# Patient Record
Sex: Female | Born: 1935
Health system: Southern US, Community
[De-identification: ages and names within clinical notes are randomized; demographics above are authoritative.]

---

## 1983-08-01 HISTORY — PX: MASTECTOMY: SHX3

## 1985-07-31 HISTORY — PX: BREAST SURGERY: SHX581

## 2005-04-27 ENCOUNTER — Emergency Department (HOSPITAL_COMMUNITY): Admission: EM | Admit: 2005-04-27 | Discharge: 2005-04-27 | Payer: Self-pay | Admitting: Emergency Medicine

## 2011-09-12 DIAGNOSIS — Z1212 Encounter for screening for malignant neoplasm of rectum: Secondary | ICD-10-CM | POA: Diagnosis not present

## 2011-09-12 DIAGNOSIS — E049 Nontoxic goiter, unspecified: Secondary | ICD-10-CM | POA: Diagnosis not present

## 2011-09-12 DIAGNOSIS — Z0000 Encounter for general adult medical examination without abnormal findings: Secondary | ICD-10-CM | POA: Diagnosis not present

## 2011-09-12 DIAGNOSIS — C50919 Malignant neoplasm of unspecified site of unspecified female breast: Secondary | ICD-10-CM | POA: Diagnosis not present

## 2011-09-18 DIAGNOSIS — C50919 Malignant neoplasm of unspecified site of unspecified female breast: Secondary | ICD-10-CM | POA: Diagnosis not present

## 2011-09-18 DIAGNOSIS — Z0000 Encounter for general adult medical examination without abnormal findings: Secondary | ICD-10-CM | POA: Diagnosis not present

## 2011-09-21 DIAGNOSIS — Z9010 Acquired absence of unspecified breast and nipple: Secondary | ICD-10-CM | POA: Diagnosis not present

## 2011-09-21 DIAGNOSIS — Z853 Personal history of malignant neoplasm of breast: Secondary | ICD-10-CM | POA: Diagnosis not present

## 2013-04-01 ENCOUNTER — Other Ambulatory Visit: Payer: Self-pay | Admitting: Endocrinology

## 2013-04-01 DIAGNOSIS — E049 Nontoxic goiter, unspecified: Secondary | ICD-10-CM

## 2013-04-03 ENCOUNTER — Ambulatory Visit
Admission: RE | Admit: 2013-04-03 | Discharge: 2013-04-03 | Disposition: A | Discharge status: Home / Self Care | Payer: Medicare Other | Source: Ambulatory Visit | Attending: Endocrinology | Admitting: Endocrinology

## 2013-04-03 DIAGNOSIS — E049 Nontoxic goiter, unspecified: Secondary | ICD-10-CM

## 2013-04-03 DIAGNOSIS — E042 Nontoxic multinodular goiter: Secondary | ICD-10-CM | POA: Diagnosis not present

## 2013-05-27 DIAGNOSIS — Z0000 Encounter for general adult medical examination without abnormal findings: Secondary | ICD-10-CM | POA: Diagnosis not present

## 2013-06-03 DIAGNOSIS — E041 Nontoxic single thyroid nodule: Secondary | ICD-10-CM | POA: Diagnosis not present

## 2013-06-04 DIAGNOSIS — R922 Inconclusive mammogram: Secondary | ICD-10-CM | POA: Diagnosis not present

## 2013-06-04 DIAGNOSIS — Z1231 Encounter for screening mammogram for malignant neoplasm of breast: Secondary | ICD-10-CM | POA: Diagnosis not present

## 2013-06-04 DIAGNOSIS — Z853 Personal history of malignant neoplasm of breast: Secondary | ICD-10-CM | POA: Diagnosis not present

## 2013-06-09 ENCOUNTER — Other Ambulatory Visit: Payer: Self-pay | Admitting: Endocrinology

## 2013-06-09 ENCOUNTER — Other Ambulatory Visit (HOSPITAL_COMMUNITY): Payer: Self-pay | Admitting: Interventional Radiology

## 2013-06-09 DIAGNOSIS — E042 Nontoxic multinodular goiter: Secondary | ICD-10-CM

## 2013-06-09 DIAGNOSIS — Z1211 Encounter for screening for malignant neoplasm of colon: Secondary | ICD-10-CM | POA: Diagnosis not present

## 2014-07-06 DIAGNOSIS — E049 Nontoxic goiter, unspecified: Secondary | ICD-10-CM | POA: Diagnosis not present

## 2014-07-06 DIAGNOSIS — Z1239 Encounter for other screening for malignant neoplasm of breast: Secondary | ICD-10-CM | POA: Diagnosis not present

## 2014-07-06 DIAGNOSIS — Z0000 Encounter for general adult medical examination without abnormal findings: Secondary | ICD-10-CM | POA: Diagnosis not present

## 2014-08-04 ENCOUNTER — Ambulatory Visit: Payer: Self-pay | Admitting: Internal Medicine

## 2014-08-04 DIAGNOSIS — Z1231 Encounter for screening mammogram for malignant neoplasm of breast: Secondary | ICD-10-CM | POA: Diagnosis not present

## 2015-09-01 ENCOUNTER — Encounter: Payer: Self-pay | Admitting: Endocrinology

## 2015-09-01 ENCOUNTER — Ambulatory Visit (INDEPENDENT_AMBULATORY_CARE_PROVIDER_SITE_OTHER): Payer: Medicare Other | Admitting: Endocrinology

## 2015-09-01 VITALS — BP 128/60 | HR 97 | Temp 98.2°F | Resp 16 | Ht 65.0 in | Wt 147.0 lb

## 2015-09-01 DIAGNOSIS — E042 Nontoxic multinodular goiter: Secondary | ICD-10-CM

## 2015-09-01 LAB — T3, FREE: T3 FREE: 3.1 pg/mL (ref 2.3–4.2)

## 2015-09-01 LAB — TSH: TSH: 1.5 u[IU]/mL (ref 0.35–4.50)

## 2015-09-01 NOTE — Progress Notes (Signed)
Patient ID: Tracie Blackwell, female   DOB: 1934-03-04, 82 y.o.   MRN: [id number]           Reason for Appointment: Goiter, new consultation    History of Present Illness:   The patient's thyroid enlargement was first discovered in the 1960s  Apparently at that time because of relatively large size of nodules she had surgery done twice in [place]  However subsequently she has had recurrence of swelling in her neck She was evaluated in 2014 with an endocrinologist but no treatment suggested She is here for further evaluation because she thinks in the last year her thyroid enlargement is progressively increasing especially on the left side  She has had no difficulty with swallowing   Does not feel like she has any choking sensation in her neck or pressure in any position or when lying down. She does not have any local discomfort  No results found for: FREET4, TSH  She has had an ultrasound exam in 06/2013 which showed the following: Multiple large nodules bilaterally. The dominant nodule in the right lobe is in the upper pole measuring 3.4 cm. The dominant nodule in the left lobe is a complex nodule in the left upper pole measuring 6.1 cm.       Medication List    Notice  As of 09/01/2015  8:57 PM   You have not been prescribed any medications.      Allergies:  Allergies  Allergen Reactions  . Penicillins     Other reaction(s): Angioedema    No past medical history on file.  Past Surgical History  Procedure Laterality Date  . Breast surgery  1987    Family History  Problem Relation Age of Onset  . Diabetes Mother   . Hypertension Mother   . Thyroid disease Sister   . Diabetes Sister   . Diabetes Brother   . Hypertension Father     Social History:  reports that she has never smoked. She has never used smokeless tobacco. Her alcohol and drug histories are not on file.   Review of Systems:  Review of Systems  Constitutional: Negative for weight loss,  weight gain and reduced appetite.  HENT: Negative for hoarseness and trouble swallowing.   Respiratory: Negative for shortness of breath.   Cardiovascular: Negative for leg swelling.  Gastrointestinal: Negative for constipation.  Endocrine: Negative for fatigue, cold intolerance and heat intolerance.      Examination:   BP 128/60 mmHg  Pulse 97  Temp(Src) 98.2 F (36.8 C)  Resp 16  Ht 5\' 5"  (1.651 m)  Wt 147 lb (66.679 kg)  BMI 24.46 kg/m2  SpO2 97%   General Appearance: pleasant, averagely built and nourished, well looking          Eyes: No  prominence or eyelid swelling.          Neck: The thyroid is enlarged significantly, more on the left.  Thyroid is about 5-6 Times normal on the left side and about 4 times on the right.  It is relatively smooth and slightly firm.  Has a 2 cm firmer nodule in the isthmus area  Neck circumference is  41 cm over the thyroid There is no stridor. Pemberton sign is negative  There is no lymphadenopathy.     Cardiovascular: Normal  heart sounds, no murmur Respiratory:  Lungs clear Abdomen: No hepatosplenomegaly or other mass Neurological: REFLEXES: at biceps are normal.  Skin: no rash  Assessment/Plan:  Multinodular goiter, probably diagnosed 50+ years ago This appears to be getting gradually larger in size Appears to be mostly external as she does not have any subjective compressive symptoms of dysphagia or choking. Her exam does not indicate any tracheal compression and Pemberton sign negative She is not interested in surgery and is asking about medical treatment or aspiration.  Discussed with the patient that usually thyroid suppression does not help reduce the size of the goiter especially if it has been long-standing Will need to review her thyroid levels decide on further treatment if any    [facility] 09/01/2015

## 2015-09-02 LAB — T4, FREE: Free T4: 0.54 ng/dL — ABNORMAL LOW (ref 0.60–1.60)

## 2015-09-06 NOTE — Progress Notes (Signed)
Quick Note:  Please let patient know that the thyroid levels looks a little low, we will like to try her on 50 g levothyroxine daily. Follow-up to be scheduled in 2 months with labs For follow-up order free T4, TSH and FSH diagnosis: Secondary hypothyroidism ______

## 2015-09-07 ENCOUNTER — Telehealth: Payer: Self-pay | Admitting: Internal Medicine

## 2015-09-07 NOTE — Telephone Encounter (Signed)
Patient is returning your call.  

## 2015-09-07 NOTE — Telephone Encounter (Signed)
Tracie Blackwell, this is your patient.

## 2015-09-08 ENCOUNTER — Other Ambulatory Visit: Payer: Self-pay | Admitting: Endocrinology

## 2015-09-08 MED ORDER — LEVOTHYROXINE SODIUM 50 MCG PO TABS: 50.0000 ug | ORAL_TABLET | Freq: Every day | ORAL | Status: DC

## 2015-09-08 NOTE — Telephone Encounter (Signed)
[facility] [address] Pt is willing to try it

## 2015-09-08 NOTE — Telephone Encounter (Signed)
See note below and please advise with med and dosage should be sent.

## 2015-09-08 NOTE — Telephone Encounter (Signed)
Prescription sent.  Please schedule appointment for 2 months, labs 2 days before

## 2015-09-09 ENCOUNTER — Other Ambulatory Visit: Payer: Self-pay | Admitting: Endocrinology

## 2015-09-09 DIAGNOSIS — E038 Other specified hypothyroidism: Secondary | ICD-10-CM

## 2015-11-02 ENCOUNTER — Other Ambulatory Visit (INDEPENDENT_AMBULATORY_CARE_PROVIDER_SITE_OTHER): Payer: Medicare Other

## 2015-11-02 DIAGNOSIS — E038 Other specified hypothyroidism: Secondary | ICD-10-CM | POA: Diagnosis not present

## 2015-11-02 DIAGNOSIS — E042 Nontoxic multinodular goiter: Secondary | ICD-10-CM | POA: Diagnosis not present

## 2015-11-02 LAB — TSH: TSH: 0.21 u[IU]/mL — AB (ref 0.35–4.50)

## 2015-11-02 LAB — FOLLICLE STIMULATING HORMONE: FSH: 49.3 m[IU]/mL

## 2015-11-02 LAB — T4, FREE: Free T4: 0.99 ng/dL (ref 0.60–1.60)

## 2015-11-04 ENCOUNTER — Encounter: Payer: Self-pay | Admitting: Endocrinology

## 2015-11-04 ENCOUNTER — Ambulatory Visit (INDEPENDENT_AMBULATORY_CARE_PROVIDER_SITE_OTHER): Payer: Medicare Other | Admitting: Endocrinology

## 2015-11-04 VITALS — BP 140/70 | HR 92 | Temp 98.3°F | Resp 14 | Ht 65.0 in | Wt 147.8 lb

## 2015-11-04 DIAGNOSIS — E042 Nontoxic multinodular goiter: Secondary | ICD-10-CM | POA: Diagnosis not present

## 2015-11-04 DIAGNOSIS — E038 Other specified hypothyroidism: Secondary | ICD-10-CM | POA: Diagnosis not present

## 2015-11-04 NOTE — Progress Notes (Signed)
Patient ID: Tracie Blackwell, female   DOB: April 16, 1934, 82 y.o.   MRN: [id number]           Reason for Appointment: Goiter, follow-up consultation    History of Present Illness:   The patient's thyroid enlargement was first discovered in the 1960s  Apparently at that time because of relatively large size of nodules she had surgery done twice in [place]  However subsequently she has had recurrence of swelling in her neck She was evaluated in 2014 with an endocrinologist but no treatment suggested Apparently in the last year her thyroid enlargement was progressively increasing especially on the left side  She has had no difficulty with swallowing, choking or local pressure On her initial visit she did not feel any unusual fatigue or cold intolerance  However because of her free T4 being only 0.54 and normal TSH she was started on levothyroxine 50 g as a trial Although subjectively she does not feel better with her energy level she thinks that her thyroid swelling significantly less especially when she wakes up in the morning  Lab Results  Component Value Date   FREET4 0.99 11/02/2015   FREET4 0.54* 09/01/2015   TSH 0.21* 11/02/2015   TSH 1.50 09/01/2015    She has had an ultrasound exam in 06/2013 which showed the following: Multiple large nodules bilaterally. The dominant nodule in the right lobe is in the upper pole measuring 3.4 cm. The dominant nodule in the left lobe is a complex nodule in the left upper pole measuring 6.1 cm.       Medication List       This list is accurate as of: 11/04/15  1:10 PM.  Always use your most recent med list.               levothyroxine 50 MCG tablet  Commonly known as:  SYNTHROID, LEVOTHROID  Take 1 tablet (50 mcg total) by mouth daily.        Allergies:  Allergies  Allergen Reactions  . Penicillins     Other reaction(s): Angioedema    No past medical history on file.  Past Surgical History  Procedure Laterality Date    . Breast surgery  1987    Family History  Problem Relation Age of Onset  . Diabetes Mother   . Hypertension Mother   . Thyroid disease Sister   . Diabetes Sister   . Diabetes Brother   . Hypertension Father     Social History:  reports that she has never smoked. She has never used smokeless tobacco. Her alcohol and drug histories are not on file.    Review of Systems      Examination:   BP 140/70 mmHg  Pulse 92  Temp(Src) 98.3 F (36.8 C)  Resp 14  Ht 5\' 5"  (1.651 m)  Wt 147 lb 12.8 oz (67.042 kg)  BMI 24.60 kg/m2  SpO2 98%            Neck: The thyroid is enlarged significantly, more on the left.  Thyroid is about 5- Times normal on the left side and about 3-4 times on the right.  It is relatively smooth and slightly firm, softer on the right.  Has a 2 cm firmer nodule in the isthmus area  Neck circumference is 39 cm over the thyroid Neurological: No tremor, REFLEXES: at biceps are normal.    Assessment/Plan:  Multinodular goiter, probably diagnosed 50+ years ago With only a short course of levothyroxine 50 g  she has had a reduction in size of the goiter both subjectively and objectively  Secondary HYPOTHYROIDISM: With her free T4 being low and TSH normal she probably has mild subclinical hypothyroidism Free T4 is back to normal with 50 g although TSH is mildly decreased as expected No signs or symptoms of over replacement of thyroxine  Patient was continued on the same regimen but follow-up in 3 months to recheck her thyroid levels    [facility] 11/04/2015

## 2016-01-14 ENCOUNTER — Other Ambulatory Visit: Payer: Self-pay | Admitting: Endocrinology

## 2016-01-31 ENCOUNTER — Other Ambulatory Visit (INDEPENDENT_AMBULATORY_CARE_PROVIDER_SITE_OTHER): Payer: Medicare Other

## 2016-01-31 DIAGNOSIS — E042 Nontoxic multinodular goiter: Secondary | ICD-10-CM | POA: Diagnosis not present

## 2016-01-31 LAB — T4, FREE: FREE T4: 0.8 ng/dL (ref 0.60–1.60)

## 2016-01-31 LAB — TSH: TSH: 0.5 u[IU]/mL (ref 0.35–4.50)

## 2016-02-03 ENCOUNTER — Ambulatory Visit (INDEPENDENT_AMBULATORY_CARE_PROVIDER_SITE_OTHER): Payer: Medicare Other | Admitting: Endocrinology

## 2016-02-03 ENCOUNTER — Encounter: Payer: Self-pay | Admitting: Endocrinology

## 2016-02-03 VITALS — BP 150/60 | HR 77 | Wt 145.5 lb

## 2016-02-03 DIAGNOSIS — E042 Nontoxic multinodular goiter: Secondary | ICD-10-CM

## 2016-02-03 NOTE — Progress Notes (Signed)
Patient ID: Tracie Blackwell, female   DOB: [birth year], 83 y.o.   MRN: [id number]           Reason for Appointment: Goiter, follow-up visit    History of Present Illness:   The patient's thyroid enlargement was first discovered in the 1960s  Apparently at that time because of relatively large size of nodules she had surgery done twice in [place] However subsequently she has had recurrence of swelling in her neck Apparently in 2016 her thyroid enlargement was progressively increasing especially on the left side  RECENT history:   On her initial visit she did not feel any unusual fatigue or cold intolerance However because of her free T4 being only 0.54 and normal TSH she was started on levothyroxine 50 g as a trial Although subjectively she did not feel better with starting the levothyroxine supplement  she stated that her thyroid swelling  feelings significantly less and less lumpy  She has had no difficulty with swallowing, choking or local pressure  Subsequently free T4 levels have been normal and now TSH is normal also  Lab Results  Component Value Date   FREET4 0.80 01/31/2016   FREET4 0.99 11/02/2015   FREET4 0.54* 09/01/2015   TSH 0.50 01/31/2016   TSH 0.21* 11/02/2015   TSH 1.50 09/01/2015    She has had an ultrasound exam in 06/2013 which showed the following: Multiple large nodules bilaterally. The dominant nodule in the right lobe is in the upper pole measuring 3.4 cm. The dominant nodule in the left lobe is a complex nodule in the left upper pole measuring 6.1 cm.       Medication List       This list is accurate as of: 02/03/16  1:06 PM.  Always use your most recent med list.               levothyroxine 50 MCG tablet  Commonly known as:  SYNTHROID, LEVOTHROID  TAKE 1 TABLET BY MOUTH EVERY DAY        Allergies:  Allergies  Allergen Reactions  . Penicillins     Other reaction(s): Angioedema    No past medical history on file.  Past Surgical  History  Procedure Laterality Date  . Breast surgery  1987    Family History  Problem Relation Age of Onset  . Diabetes Mother   . Hypertension Mother   . Thyroid disease Sister   . Diabetes Sister   . Diabetes Brother   . Hypertension Father     Social History:  reports that she has never smoked. She has never used smokeless tobacco. Her alcohol and drug histories are not on file.    Review of Systems    Examination:   BP 150/60 mmHg  Pulse 77  Wt 145 lb 8 oz (65.998 kg)  SpO2 97%             The thyroid is enlarged significantly, more on the left.  Thyroid is about 4-5 Times normal on the left side and about 3-4 times on the right.   It is smooth and soft, especially on the right.  Has a 1.5 cm firmer nodule in the isthmus area  Neck circumference is 39 cm over the thyroid Deep tendon reflexes at biceps are normal.    Assessment/Plan:  Multinodular goiter, probably diagnosed 50+ years ago With a regimen of levothyroxine 50 g she has had a reduction in size of the goiter both subjectively and objectively, stable since  her last visit The currently feels relatively soft probably indicating accumulation of colloid   Smaller firmer nodule in the isthmus is smaller also  Secondary HYPOTHYROIDISM:  Although she was probably asymptomatic with her baseline free T4 level being low her free T4 is now consistently normal with the low-dose supplement Subjectively she still feels fairly good  TSH is normal now  She will continue on the same regimen with six-month follow-up    [facility] 02/03/2016

## 2016-05-14 ENCOUNTER — Other Ambulatory Visit: Payer: Self-pay | Admitting: Endocrinology

## 2016-08-01 ENCOUNTER — Other Ambulatory Visit (INDEPENDENT_AMBULATORY_CARE_PROVIDER_SITE_OTHER): Payer: Medicare Other

## 2016-08-01 DIAGNOSIS — E042 Nontoxic multinodular goiter: Secondary | ICD-10-CM | POA: Diagnosis not present

## 2016-08-01 LAB — T4, FREE: Free T4: 0.7 ng/dL (ref 0.60–1.60)

## 2016-08-01 LAB — TSH: TSH: 0.72 u[IU]/mL (ref 0.35–4.50)

## 2016-08-04 ENCOUNTER — Encounter: Payer: Self-pay | Admitting: Endocrinology

## 2016-08-04 ENCOUNTER — Ambulatory Visit (INDEPENDENT_AMBULATORY_CARE_PROVIDER_SITE_OTHER): Payer: Medicare Other | Admitting: Endocrinology

## 2016-08-04 VITALS — BP 130/68 | HR 76 | Ht 66.93 in | Wt 147.4 lb

## 2016-08-04 DIAGNOSIS — E042 Nontoxic multinodular goiter: Secondary | ICD-10-CM

## 2016-08-04 MED ORDER — LEVOTHYROXINE SODIUM 125 MCG PO TABS: 62.5000 ug | ORAL_TABLET | Freq: Every day | ORAL | 4 refills | Status: DC

## 2016-08-04 NOTE — Progress Notes (Signed)
Patient ID: Tracie Blackwell, female   DOB: 1933/08/05, 83 y.o.   MRN: [id number]           Reason for Appointment: Goiter, follow-up visit    History of Present Illness:   The patient's thyroid enlargement was first discovered in the 1960s  Apparently at that time because of relatively large size of nodules she had surgery done twice in [place] However subsequently she has had recurrence of swelling in her neck Apparently in 2016 her thyroid enlargement was progressively increasing especially on the left side  RECENT history:   On her initial visit she did not feel any unusual fatigue or cold intolerance However because of her free T4 being only 0.54 and normal TSH she was started on levothyroxine 50 g  Although subjectively she did not feel better with starting the levothyroxine supplement she subjectively and objectively felt her thyroid swelling to be reduced She now also states that she can lie flat in her bed instead of having to prop her head on pillows as before Also she does not has much hoarseness on talking  Her free T4 appears to be declining compared to 4/17 although she does not think she has any unusual fatigue now   Lab Results  Component Value Date   FREET4 0.70 08/01/2016   FREET4 0.80 01/31/2016   FREET4 0.99 11/02/2015   TSH 0.72 08/01/2016   TSH 0.50 01/31/2016   TSH 0.21 (L) 11/02/2015    She has had an ultrasound exam in 06/2013 which showed the following: Multiple large nodules bilaterally. The dominant nodule in the right lobe is in the upper pole measuring 3.4 cm. The dominant nodule in the left lobe is a complex nodule in the left upper pole measuring 6.1 cm.     Allergies as of 08/04/2016      Reactions   Penicillins    Other reaction(s): Angioedema      Medication List       Accurate as of 08/04/16  1:12 PM. Always use your most recent med list.          levothyroxine 50 MCG tablet Commonly known as:  SYNTHROID, LEVOTHROID TAKE 1  TABLET BY MOUTH EVERY DAY       Allergies:  Allergies  Allergen Reactions  . Penicillins     Other reaction(s): Angioedema    No past medical history on file.  Past Surgical History:  Procedure Laterality Date  . BREAST SURGERY  1987    Family History  Problem Relation Age of Onset  . Diabetes Mother   . Hypertension Mother   . Thyroid disease Sister   . Diabetes Sister   . Diabetes Brother   . Hypertension Father     Social History:  reports that she has never smoked. She has never used smokeless tobacco. Her alcohol and drug histories are not on file.    Review of Systems    Examination:   BP 130/68   Pulse 76   Ht 5' 6.93" (1.7 m)   Wt 147 lb 6.4 oz (66.9 kg)   SpO2 97%   BMI 23.14 kg/m              The thyroid is enlarged significantly, more on the left.  Thyroid is about 4-5 Times normal on the left side and about 3-4 times on the right.   It is smooth and soft, especially on the right.  Has a 1.5 cm firmer nodule in the isthmus area  Neck circumference is 39.5 cm over the thyroid Deep tendon reflexes at biceps are normal.    Assessment/Plan:  Multinodular goiter, diagnosed 50+ years ago With the continued regimen of levothyroxine 50 g she has had subjectively less pressure symptoms as above Also previously had a reduction in size of the goiter objectively  Currently the thyroid feels about the same or minimally larger on measurement of the neck circumference. Still has a relatively soft texture  Secondary HYPOTHYROIDISM:  She was  asymptomatic with baseline free T4 level being low Has been continued on 50 g levothyroxine but her free T4 is drifting lower compared to her previous 2 follow-up visits  Subjectively she still feels fairly good  Since she may benefit from further reduction in the size of the goiter will increase her dose to 62.5 g levothyroxine and follow-up in 4 months   [facility] 08/04/2016

## 2016-11-27 ENCOUNTER — Other Ambulatory Visit (INDEPENDENT_AMBULATORY_CARE_PROVIDER_SITE_OTHER): Payer: Medicare Other

## 2016-11-27 DIAGNOSIS — E042 Nontoxic multinodular goiter: Secondary | ICD-10-CM

## 2016-11-27 LAB — TSH: TSH: 0.63 u[IU]/mL (ref 0.35–4.50)

## 2016-11-27 LAB — T4, FREE: FREE T4: 0.76 ng/dL (ref 0.60–1.60)

## 2016-11-28 ENCOUNTER — Other Ambulatory Visit: Payer: Medicare Other

## 2016-12-01 ENCOUNTER — Encounter: Payer: Self-pay | Admitting: Endocrinology

## 2016-12-01 ENCOUNTER — Ambulatory Visit (INDEPENDENT_AMBULATORY_CARE_PROVIDER_SITE_OTHER): Payer: Medicare Other | Admitting: Endocrinology

## 2016-12-01 VITALS — BP 130/78 | HR 81 | Ht 66.0 in | Wt 144.2 lb

## 2016-12-01 DIAGNOSIS — E042 Nontoxic multinodular goiter: Secondary | ICD-10-CM | POA: Diagnosis not present

## 2016-12-01 NOTE — Progress Notes (Signed)
Patient ID: Tracie Blackwell, female   DOB: 1934/06/09, 83 y.o.   MRN: [id number]           Reason for Appointment: Goiter, follow-up visit    History of Present Illness:   The patient's thyroid enlargement was first discovered in the 1960s  Apparently at that time because of relatively large size of nodules she had surgery done twice in [place] However subsequently she has had recurrence of swelling in her neck Apparently in 2016 her thyroid enlargement was progressively increasing especially on the left side  RECENT history:   On her initial visit she did not feel any unusual fatigue or cold intolerance However because of her free T4 being only 0.54 and normal TSH she was started on levothyroxine 50 g  Although subjectively she did not feel better with starting the levothyroxine supplement she subjectively and objectively felt her thyroid swelling to be reduced She now also states that she can lie flat in her bed instead of having to prop her head on pillows as before Also she does not has much hoarseness  No difficulty swallowing   Her free T4 was low normal in 1/18 and her dose was increased up to 62.5 g daily She feels great with her energy level now Initially with the change she thought she was getting more hair loss but this is better now   Lab Results  Component Value Date   FREET4 0.76 11/27/2016   FREET4 0.70 08/01/2016   FREET4 0.80 01/31/2016   TSH 0.63 11/27/2016   TSH 0.72 08/01/2016   TSH 0.50 01/31/2016    She has had an ultrasound exam in 06/2013 which showed the following: Multiple large nodules bilaterally. The dominant nodule in the right lobe is in the upper pole measuring 3.4 cm. The dominant nodule in the left lobe is a complex nodule in the left upper pole measuring 6.1 cm.     Allergies as of 12/01/2016      Reactions   Penicillins    Other reaction(s): Angioedema      Medication List       Accurate as of 12/01/16  1:08 PM. Always use  your most recent med list.          levothyroxine 125 MCG tablet Commonly known as:  SYNTHROID, LEVOTHROID Take 0.5 tablets (62.5 mcg total) by mouth daily.       Allergies:  Allergies  Allergen Reactions  . Penicillins     Other reaction(s): Angioedema    No past medical history on file.  Past Surgical History:  Procedure Laterality Date  . BREAST SURGERY  1987    Family History  Problem Relation Age of Onset  . Diabetes Mother   . Hypertension Mother   . Thyroid disease Sister   . Diabetes Sister   . Diabetes Brother   . Hypertension Father     Social History:  reports that she has never smoked. She has never used smokeless tobacco. Her alcohol and drug histories are not on file.    Review of Systems    Examination:   BP 130/78   Pulse 81   Ht 5\' 6"  (1.676 m)   Wt 144 lb 3.2 oz (65.4 kg)   SpO2 98%   BMI 23.27 kg/m             The thyroid is enlarged significantly, more on the left.   Enlargement is about 4 Times normal on the left side and about 3-4 times  on the right.   It is smooth and soft, especially on the right.  Has a 1.5 cm firmer nodule in the isthmus area  Neck circumference is 39.5 cm over the thyroid Deep tendon reflexes at right biceps are normal.    Assessment/Plan:  Multinodular goiter, diagnosed 50+ years ago  She appears to have had some reduction in the thyroid size suppression using levothyroxine, this may be because of her goiter being relatively softer and not having as much degenerated tissue or colloid By exam is about the same as on the last visit  Secondary HYPOTHYROIDISM:  She was  asymptomatic with baseline free T4 level being low Her thyroid levels are slightly better especially free T4 with using 62.5 g levothyroxine and will continue  Follow-up in 6 months   [facility] 12/01/2016

## 2017-01-12 ENCOUNTER — Telehealth: Payer: Self-pay | Admitting: Endocrinology

## 2017-01-12 NOTE — Telephone Encounter (Signed)
Patient would like someone to call her back. She just received a call from [facility] saying that her [facility] rx has been cancelled. Pt states that she is down to 2 pills.

## 2017-01-12 NOTE — Telephone Encounter (Signed)
Called and granddaughter Tracie Blackwell answered the phone and said her grandmother stepped out and not sure when would be home. I will look into this refill for her.

## 2017-01-15 ENCOUNTER — Other Ambulatory Visit: Payer: Self-pay

## 2017-01-15 MED ORDER — LEVOTHYROXINE SODIUM 125 MCG PO TABS: 62.5000 ug | ORAL_TABLET | Freq: Every day | ORAL | 4 refills | Status: DC

## 2017-01-15 NOTE — Telephone Encounter (Signed)
Patient called back in to follow up. States that she took her last pill this morning. Ok to leave vm

## 2017-01-15 NOTE — Telephone Encounter (Signed)
Called patient and let her know that I sent her refill for her [facility] to [facility] by electronic refill.

## 2017-01-15 NOTE — Telephone Encounter (Signed)
[facility] needs to [facility] for [facility], asap the pt is out now.

## 2017-06-13 ENCOUNTER — Other Ambulatory Visit: Payer: Self-pay

## 2017-06-13 ENCOUNTER — Telehealth: Payer: Self-pay | Admitting: Endocrinology

## 2017-06-13 MED ORDER — LEVOTHYROXINE SODIUM 125 MCG PO TABS: 62.5000 ug | ORAL_TABLET | Freq: Every day | ORAL | 4 refills | Status: DC

## 2017-06-13 NOTE — Telephone Encounter (Signed)
Called patient and let her know that I have sent in a refill for her [facility] to [facility] [address].

## 2017-06-13 NOTE — Telephone Encounter (Signed)
New Message   *STAT* If patient is at the pharmacy, call can be transferred to refill team.   1. Which medications need to be refilled? (please list name of each medication and dose if known)  levothyroxine 125 mcg tablet, take .5 tablets by mouth daily  2. Which pharmacy/location (including street and city if local pharmacy) is medication to be sent to? [id number], [address], [place], [place]  3. Do they need a 30 day or 90 day supply?  30 days

## 2017-07-18 ENCOUNTER — Other Ambulatory Visit: Payer: Self-pay

## 2017-10-05 ENCOUNTER — Other Ambulatory Visit: Payer: Self-pay | Admitting: Endocrinology

## 2018-01-30 ENCOUNTER — Telehealth: Payer: Self-pay | Admitting: Endocrinology

## 2018-01-30 ENCOUNTER — Other Ambulatory Visit: Payer: Self-pay | Admitting: Endocrinology

## 2018-01-30 ENCOUNTER — Other Ambulatory Visit: Payer: Self-pay

## 2018-01-30 NOTE — Telephone Encounter (Signed)
Last ov 12/01/16 and no future scheduled refill or refuse please advise

## 2018-01-30 NOTE — Telephone Encounter (Signed)
Needs to make appointment for further refills

## 2018-01-30 NOTE — Telephone Encounter (Signed)
Patient is returning your call.  

## 2018-01-30 NOTE — Telephone Encounter (Signed)
levothyroxine (SYNTHROID, LEVOTHROID) 125 MCG tablet    Patient stated that Pharmacy will not refill her medication and for her to call our office to have Dr Dwyane Dee send it in.  Please advise   [id number] [place], [place] [address]

## 2018-01-30 NOTE — Telephone Encounter (Signed)
Called and left message for pt to call back so she can be informed that Dr. Dwyane Dee has refused to refill medication until pt is seen in office for a visit.

## 2018-01-30 NOTE — Telephone Encounter (Signed)
Pt called and notified that she cannot receive a refill until she has been seen. Pt was given office phone number and she stated that she will call Friday and schedule an appointment.

## 2018-02-04 ENCOUNTER — Other Ambulatory Visit (INDEPENDENT_AMBULATORY_CARE_PROVIDER_SITE_OTHER): Payer: Medicare Other

## 2018-02-04 DIAGNOSIS — E042 Nontoxic multinodular goiter: Secondary | ICD-10-CM

## 2018-02-04 LAB — T4, FREE: FREE T4: 0.77 ng/dL (ref 0.60–1.60)

## 2018-02-04 LAB — TSH: TSH: 0.81 u[IU]/mL (ref 0.35–4.50)

## 2018-02-07 ENCOUNTER — Encounter: Payer: Self-pay | Admitting: Endocrinology

## 2018-02-07 ENCOUNTER — Ambulatory Visit (INDEPENDENT_AMBULATORY_CARE_PROVIDER_SITE_OTHER): Payer: Medicare Other | Admitting: Endocrinology

## 2018-02-07 VITALS — BP 140/76 | HR 88 | Ht 66.0 in | Wt 143.4 lb

## 2018-02-07 DIAGNOSIS — E038 Other specified hypothyroidism: Secondary | ICD-10-CM | POA: Diagnosis not present

## 2018-02-07 DIAGNOSIS — E042 Nontoxic multinodular goiter: Secondary | ICD-10-CM

## 2018-02-07 MED ORDER — LEVOTHYROXINE SODIUM 125 MCG PO TABS: 62.5000 ug | ORAL_TABLET | Freq: Every day | ORAL | 10 refills | Status: DC

## 2018-02-07 NOTE — Progress Notes (Signed)
Patient ID: Tracie Blackwell, female   DOB: [birth year], 84 y.o.   MRN: [id number]           Reason for Appointment: Goiter, follow-up visit    History of Present Illness:   The patient's thyroid enlargement was first discovered in the 1960s  Apparently at that time because of relatively large size of nodules she had surgery done twice in [place] However subsequently she has had recurrence of swelling in her neck Apparently in 2016 her thyroid enlargement was progressively increasing especially on the left side  RECENT history:   On her initial visit she did not feel any unusual fatigue or cold intolerance However because of her free T4 being only 0.54 and normal TSH she was started on levothyroxine 50 g  Although subjectively she did not feel better with starting the levothyroxine supplement she subjectively and objectively felt her thyroid swelling to be reduced Also she was able to lie flat in her bed instead of having to prop her head on pillows as before  Recently again feels clear without any pressure sensation or difficulty with breathing No difficulty swallowing   Her free T4 was low normal in 1/18 and her levothyroxine dose was increased up to 62.5 g daily She has had no fatigue  She has not been seen in follow-up since 11/2016  Her thyroid levels are about the same and normal   Lab Results  Component Value Date   FREET4 0.77 02/04/2018   FREET4 0.76 11/27/2016   FREET4 0.70 08/01/2016   TSH 0.81 02/04/2018   TSH 0.63 11/27/2016   TSH 0.72 08/01/2016    She has had an ultrasound exam in 06/2013 which showed the following: Multiple large nodules bilaterally. The dominant nodule in the right lobe is in the upper pole measuring 3.4 cm. The dominant nodule in the left lobe is a complex nodule in the left upper pole measuring 6.1 cm.     Allergies as of 02/07/2018      Reactions   Penicillins    Other reaction(s): Angioedema      Medication List        Accurate as of 02/07/18  9:25 AM. Always use your most recent med list.          levothyroxine 125 MCG tablet Commonly known as:  SYNTHROID, LEVOTHROID Take 0.5 tablets (62.5 mcg total) daily by mouth.   levothyroxine 125 MCG tablet Commonly known as:  SYNTHROID, LEVOTHROID TAKE 1/2 TABLET BY MOUTH EVERY DAY       Allergies:  Allergies  Allergen Reactions  . Penicillins     Other reaction(s): Angioedema    History reviewed. No pertinent past medical history.  Past Surgical History:  Procedure Laterality Date  . BREAST SURGERY  1987    Family History  Problem Relation Age of Onset  . Diabetes Mother   . Hypertension Mother   . Thyroid disease Sister   . Diabetes Sister   . Diabetes Brother   . Hypertension Father     Social History:  reports that she has never smoked. She has never used smokeless tobacco. Her alcohol and drug histories are not on file.    Review of Systems    Examination:   BP 140/76 (BP Location: Left Arm, Patient Position: Sitting, Cuff Size: Normal)   Pulse 88   Ht 5\' 6"  (1.676 m)   Wt 143 lb 6.4 oz (65 kg)   SpO2 98%   BMI 23.15 kg/m  The thyroid is enlarged si bilaterally gnificantly, more visible on the left.   Thyroid is enlarged about 4 times  normal on the left side and about 3-4 times on the right.   It is smooth and soft, on the right. Left lobe is smooth and firmer and feels like a golf ball Has a 1.5 cm firm nodule in the isthmus area  Neck circumference is 39.5 cm over the thyroid No stridor  Deep tendon reflexes at right biceps are normal.    Assessment/Plan:  Multinodular goiter, diagnosed 50+ years ago  She initially had reduction in the thyroid size using suppression with levothyroxine Symptomatically she also felt better  Thyroid exam is about the same as on the last visit and neck circumference is the same  Secondary HYPOTHYROIDISM:  She was  asymptomatic with baseline free T4 level being  low Her thyroid levels are slig stable with using 62.5 mcg levothyroxine and in the normal range She subjectively feels good  She will continue to follow-up annually   Tracie Blackwell 02/07/2018

## 2018-12-02 ENCOUNTER — Other Ambulatory Visit: Payer: Self-pay

## 2018-12-05 ENCOUNTER — Ambulatory Visit (INDEPENDENT_AMBULATORY_CARE_PROVIDER_SITE_OTHER): Payer: Medicare Other | Admitting: Endocrinology

## 2018-12-05 ENCOUNTER — Other Ambulatory Visit: Payer: Self-pay

## 2018-12-05 ENCOUNTER — Encounter: Payer: Self-pay | Admitting: Endocrinology

## 2018-12-05 VITALS — BP 136/62 | HR 95 | Ht 66.0 in | Wt 145.8 lb

## 2018-12-05 DIAGNOSIS — E038 Other specified hypothyroidism: Secondary | ICD-10-CM | POA: Diagnosis not present

## 2018-12-05 DIAGNOSIS — E042 Nontoxic multinodular goiter: Secondary | ICD-10-CM | POA: Diagnosis not present

## 2018-12-05 NOTE — Progress Notes (Addendum)
Patient ID: Tracie Blackwell, female   DOB: 1934/04/12, 85 y.o.   MRN: [id number]           Reason for Appointment: Thyroid problems, follow-up visit    History of Present Illness:   The patient's thyroid enlargement was first discovered in the 1960s  Apparently at that time because of relatively large size of nodules she had surgery done twice in [place] However subsequently she has had recurrence of swelling in her neck Apparently in 2016 her thyroid enlargement was progressively increasing especially on the left side  RECENT history:   On her initial visit she did not feel any symptoms of fatigue or cold intolerance However because of her free T4 being only 0.54 and normal TSH she was started on levothyroxine 50 g  She did not feel any different with starting the levothyroxine supplement However she felt her thyroid swelling to be reduced Also she was able to lie flat in her bed instead of having to prop her head on pillows as before  Her last visit was in 7/19 She did not feel any pressure or choking in her neck and no trouble swallowing   Thyroid supplement: She has been on the higher dose of 62.5 mcg since 07/2016 As before she did not complain of any unusual fatigue, no recent weight change  Wt Readings from Last 3 Encounters:  12/05/18 145 lb 12.8 oz (66.1 kg)  02/07/18 143 lb 6.4 oz (65 kg)  12/01/16 144 lb 3.2 oz (65.4 kg)     Previous labs:   Lab Results  Component Value Date   FREET4 0.77 02/04/2018   FREET4 0.76 11/27/2016   FREET4 0.70 08/01/2016   TSH 0.81 02/04/2018   TSH 0.63 11/27/2016   TSH 0.72 08/01/2016    She has had an ultrasound exam in 06/2013 which showed the following: Multiple large nodules bilaterally. The dominant nodule in the right lobe is in the upper pole measuring 3.4 cm. The dominant nodule in the left lobe is a complex nodule in the left upper pole measuring 6.1 cm.     Allergies as of 12/05/2018      Reactions   Penicillins    Other reaction(s): Angioedema      Medication List       Accurate as of Dec 05, 2018  3:30 PM. If you have any questions, ask your nurse or doctor.        levothyroxine 125 MCG tablet Commonly known as:  SYNTHROID Take 0.5 tablets (62.5 mcg total) by mouth daily.       Allergies:  Allergies  Allergen Reactions  . Penicillins     Other reaction(s): Angioedema    History reviewed. No pertinent past medical history.  Past Surgical History:  Procedure Laterality Date  . BREAST SURGERY  1987    Family History  Problem Relation Age of Onset  . Diabetes Mother   . Hypertension Mother   . Thyroid disease Sister   . Diabetes Sister   . Diabetes Brother   . Hypertension Father     Social History:  reports that she has never smoked. She has never used smokeless tobacco. No history on file for alcohol and drug.    Review of Systems   She is followed annually for physical exam with PCP, is going to be scheduled soon No history of diabetes, hypertension or lipid problems despite family history   Examination:   BP 136/62 (BP Location: Left Arm, Patient Position: Sitting, Cuff  Size: Normal)   Pulse 95   Ht 5\' 6"  (1.676 m)   Wt 145 lb 12.8 oz (66.1 kg)   SpO2 96%   BMI 23.53 kg/m             She has a large visible goiter More prominent on the left Left side is about 5 times normal and right side about 4 times Left lobe is significantly softer and smoother, right lobe is firmer but smooth Also small 1-1.5 cm nodule in the isthmus anteriorly  Neck circumference is 38 .5 cm over the thyroid No lymphadenopathy in the neck  Skin appears normal   Assessment/Plan:  Large bilateral multinodular goiter, diagnosed 50+ years ago  She initially had reduction in the thyroid size using suppression with levothyroxine Again it appears to be smaller on neck circumference measurement today compared to last year No pressure symptoms currently   Secondary  HYPOTHYROIDISM:  She was  asymptomatic with a low baseline free T4 level She has done subjectively better with levothyroxine supplementation and has been on the same dose for some time Currently on 62.5 mcg levothyroxine Labs to be checked today   She will continue to follow-up annually   [name] 12/05/2018  Note: This office note was prepared with Dragon voice recognition system technology. Any transcriptional errors that result from this process are unintentional.  Addendum: Free T4 normal at 0.81, TSH normal, continue same dose

## 2018-12-06 LAB — TSH: TSH: 0.86 u[IU]/mL (ref 0.35–4.50)

## 2018-12-06 LAB — T4, FREE: Free T4: 0.81 ng/dL (ref 0.60–1.60)

## 2018-12-06 NOTE — Progress Notes (Signed)
Please call to let patient know that the lab results are normal and no further action needed

## 2018-12-21 ENCOUNTER — Other Ambulatory Visit: Payer: Self-pay | Admitting: Endocrinology

## 2019-04-30 ENCOUNTER — Other Ambulatory Visit: Payer: Self-pay | Admitting: Infectious Diseases

## 2019-04-30 DIAGNOSIS — E039 Hypothyroidism, unspecified: Secondary | ICD-10-CM | POA: Diagnosis not present

## 2019-04-30 DIAGNOSIS — Z23 Encounter for immunization: Secondary | ICD-10-CM | POA: Diagnosis not present

## 2019-04-30 DIAGNOSIS — Z0000 Encounter for general adult medical examination without abnormal findings: Secondary | ICD-10-CM | POA: Diagnosis not present

## 2019-04-30 DIAGNOSIS — E042 Nontoxic multinodular goiter: Secondary | ICD-10-CM | POA: Diagnosis not present

## 2019-04-30 DIAGNOSIS — Z853 Personal history of malignant neoplasm of breast: Secondary | ICD-10-CM | POA: Diagnosis not present

## 2019-04-30 DIAGNOSIS — Z1231 Encounter for screening mammogram for malignant neoplasm of breast: Secondary | ICD-10-CM

## 2019-06-11 ENCOUNTER — Ambulatory Visit
Admission: RE | Admit: 2019-06-11 | Discharge: 2019-06-11 | Disposition: A | Discharge status: Home / Self Care | Payer: Medicare Other | Source: Ambulatory Visit | Attending: Infectious Diseases | Admitting: Infectious Diseases

## 2019-06-11 DIAGNOSIS — Z1231 Encounter for screening mammogram for malignant neoplasm of breast: Secondary | ICD-10-CM | POA: Insufficient documentation

## 2019-09-14 ENCOUNTER — Ambulatory Visit: Payer: Medicare Other | Attending: Internal Medicine

## 2019-09-14 DIAGNOSIS — Z23 Encounter for immunization: Secondary | ICD-10-CM | POA: Insufficient documentation

## 2019-09-14 NOTE — Progress Notes (Signed)
   Covid-19 Vaccination Clinic  Name:  Tracie Blackwell    MRN: [id number] DOB: 12/26/1933  09/14/2019  Ms. Punch was observed post Covid-19 immunization for 15 minutes without incidence. She was provided with Vaccine Information Sheet and instruction to access the V-Safe system.   [name] was instructed to call 911 with any severe reactions post vaccine: Marland Kitchen Difficulty breathing  . Swelling of your face and throat  . A fast heartbeat  . A bad rash all over your body  . Dizziness and weakness    Immunizations Administered    Name Date Dose VIS Date Route   Pfizer COVID-19 Vaccine 09/14/2019  9:57 AM 0.3 mL 07/11/2019 Intramuscular   Manufacturer: [place]   Lot: [id number]   [place]: [id number]

## 2019-10-08 ENCOUNTER — Ambulatory Visit: Payer: Medicare Other | Attending: Internal Medicine

## 2019-10-08 DIAGNOSIS — Z23 Encounter for immunization: Secondary | ICD-10-CM

## 2019-10-08 NOTE — Progress Notes (Signed)
   Covid-19 Vaccination Clinic  Name:  [name]    MRN: [id number] DOB: 09-02-1933  10/08/2019  [name] was observed post Covid-19 immunization for 15 minutes without incident. She was provided with Vaccine Information Sheet and instruction to access the V-Safe system.   [name] was instructed to call 911 with any severe reactions post vaccine: Marland Kitchen Difficulty breathing  . Swelling of face and throat  . A fast heartbeat  . A bad rash all over body  . Dizziness and weakness   Immunizations Administered    Name Date Dose VIS Date Route   Pfizer COVID-19 Vaccine 10/08/2019  3:09 PM 0.3 mL 07/11/2019 Intramuscular   Manufacturer: [place]   Lot: [id number]   [place]: [id number]

## 2019-12-04 NOTE — Progress Notes (Signed)
Patient ID: Tracie Blackwell, female   DOB: November 23, 1933, 86 y.o.   MRN: [id number]           Reason for Appointment:  follow-up of thyroid    History of Present Illness:   The patient's thyroid enlargement was first discovered in the 1960s  Apparently at that time because of relatively large size of nodules she had surgery done twice in [place] However subsequently she has had recurrence of swelling in her neck Apparently in 2016 her thyroid enlargement was progressively increasing especially on the left side  RECENT history:   On her initial visit she did not feel any symptoms of fatigue or cold intolerance However because of her free T4 being only 0.54 and normal TSH she was started on levothyroxine 50 g  She did not feel any different with starting the levothyroxine supplement However she felt her thyroid swelling was smaller after starting thyroid supplement Also she was able to lie flat in her bed instead of having to prop her head on pillows as before  She is back for her annual visit She is feeling fairly good with no local discomfort or pressure in the neck and no difficulty swallowing She thinks that sometimes in the afternoon her neck swelling recedes Her left lobe is larger than the right  No complaints of unusual fatigue or weight change  She continues levothyroxine 62.5 mcg, last dosage change 07/2016 She has taken this regularly and labs will be done today  Wt Readings from Last 3 Encounters:  12/05/19 143 lb 12.8 oz (65.2 kg)  12/05/18 145 lb 12.8 oz (66.1 kg)  02/07/18 143 lb 6.4 oz (65 kg)     Previous labs:   Lab Results  Component Value Date   FREET4 0.81 12/05/2018   FREET4 0.77 02/04/2018   FREET4 0.76 11/27/2016   TSH 0.86 12/05/2018   TSH 0.81 02/04/2018   TSH 0.63 11/27/2016    She has had an ultrasound exam in 06/2013 which showed the following: Multiple large nodules bilaterally. The dominant nodule in the right lobe is in the upper pole  measuring 3.4 cm. The dominant nodule in the left lobe is a complex nodule in the left upper pole measuring 6.1 cm.     Allergies as of 12/05/2019      Reactions   Penicillins    Other reaction(s): Angioedema      Medication List       Accurate as of Dec 05, 2019 10:47 AM. If you have any questions, ask your nurse or doctor.        levothyroxine 125 MCG tablet Commonly known as: SYNTHROID TAKE 1/2 TABLET BY MOUTH DAILY.       Allergies:  Allergies  Allergen Reactions  . Penicillins     Other reaction(s): Angioedema    History reviewed. No pertinent past medical history.  Past Surgical History:  Procedure Laterality Date  . BREAST SURGERY  1987  . MASTECTOMY Right 1985    Family History  Problem Relation Age of Onset  . Diabetes Mother   . Hypertension Mother   . Thyroid disease Sister   . Diabetes Sister   . Diabetes Brother   . Hypertension Father   . Breast cancer Neg Hx     Social History:  reports that she has never smoked. She has never used smokeless tobacco. No history on file for alcohol and drug.    Review of Systems   She is followed annually for physical exam  with PCP    Examination:   BP 138/68 (BP Location: Left Arm, Patient Position: Sitting, Cuff Size: Normal)   Pulse 88   Ht 5\' 6"  (1.676 m)   Wt 143 lb 12.8 oz (65.2 kg)   SpO2 98%   BMI 23.21 kg/m             She has a large bilateral goiter, more prominent on the left side  Left side is about 6.5-7 cm across and very large overall Right lobe is somewhat softer and smoother, left lobe is firm No tenderness  Neck circumference is 38.5-39 cm over the thyroid Pemberton sign negative, no stridor  No lymphadenopathy in the neck  Biceps reflexes normal   Assessment/Plan:  Large bilateral multinodular goiter, diagnosed 50+ years ago Previously treated with surgery twice  She initially had reduction in the thyroid size using levothyroxine suppression  Since last year  her thyroid size appears to be about the same as seen on her neck circumference Left side is larger as before She again has no symptoms of local pressure and Pemberton sign negative   Secondary HYPOTHYROIDISM:  She was  asymptomatic with a low baseline free T4 level and did not feel different with supplementation  Has been on 62.5 mcg levothyroxine Labs to be checked today   She will continue to follow-up annually unless she has any new symptoms   [name] 12/05/2019  Note: This office note was prepared with Dragon voice recognition system technology. Any transcriptional errors that result from this process are unintentional.

## 2019-12-05 ENCOUNTER — Ambulatory Visit (INDEPENDENT_AMBULATORY_CARE_PROVIDER_SITE_OTHER): Payer: Medicare Other | Admitting: Endocrinology

## 2019-12-05 ENCOUNTER — Encounter: Payer: Self-pay | Admitting: Endocrinology

## 2019-12-05 ENCOUNTER — Other Ambulatory Visit: Payer: Self-pay

## 2019-12-05 VITALS — BP 138/68 | HR 88 | Ht 66.0 in | Wt 143.8 lb

## 2019-12-05 DIAGNOSIS — E038 Other specified hypothyroidism: Secondary | ICD-10-CM

## 2019-12-05 DIAGNOSIS — E042 Nontoxic multinodular goiter: Secondary | ICD-10-CM | POA: Diagnosis not present

## 2019-12-05 LAB — TSH: TSH: 0.89 u[IU]/mL (ref 0.35–4.50)

## 2019-12-05 LAB — T4, FREE: Free T4: 0.73 ng/dL (ref 0.60–1.60)

## 2019-12-08 NOTE — Progress Notes (Signed)
Please call to let patient know that the lab results are normal and no change in dosage needed

## 2019-12-12 ENCOUNTER — Other Ambulatory Visit: Payer: Self-pay | Admitting: Endocrinology

## 2020-05-12 ENCOUNTER — Other Ambulatory Visit: Payer: Self-pay | Admitting: Infectious Diseases

## 2020-05-12 DIAGNOSIS — Z1231 Encounter for screening mammogram for malignant neoplasm of breast: Secondary | ICD-10-CM

## 2020-06-17 ENCOUNTER — Ambulatory Visit
Admission: RE | Admit: 2020-06-17 | Discharge: 2020-06-17 | Disposition: A | Discharge status: Home / Self Care | Payer: Medicare Other | Source: Ambulatory Visit | Attending: Infectious Diseases | Admitting: Infectious Diseases

## 2020-06-17 ENCOUNTER — Other Ambulatory Visit: Payer: Self-pay

## 2020-06-17 DIAGNOSIS — Z1231 Encounter for screening mammogram for malignant neoplasm of breast: Secondary | ICD-10-CM | POA: Diagnosis not present

## 2020-11-28 ENCOUNTER — Other Ambulatory Visit: Payer: Self-pay | Admitting: Endocrinology

## 2020-11-28 DIAGNOSIS — E042 Nontoxic multinodular goiter: Secondary | ICD-10-CM

## 2020-11-30 ENCOUNTER — Other Ambulatory Visit (INDEPENDENT_AMBULATORY_CARE_PROVIDER_SITE_OTHER): Payer: Medicare Other

## 2020-11-30 ENCOUNTER — Other Ambulatory Visit: Payer: Self-pay

## 2020-11-30 DIAGNOSIS — E042 Nontoxic multinodular goiter: Secondary | ICD-10-CM

## 2020-12-01 ENCOUNTER — Other Ambulatory Visit: Payer: Self-pay | Admitting: Endocrinology

## 2020-12-01 LAB — T4, FREE: Free T4: 0.94 ng/dL (ref 0.60–1.60)

## 2020-12-01 LAB — TSH: TSH: 0.53 u[IU]/mL (ref 0.35–4.50)

## 2020-12-07 ENCOUNTER — Encounter: Payer: Self-pay | Admitting: Endocrinology

## 2020-12-07 ENCOUNTER — Other Ambulatory Visit: Payer: Self-pay

## 2020-12-07 ENCOUNTER — Ambulatory Visit (INDEPENDENT_AMBULATORY_CARE_PROVIDER_SITE_OTHER): Payer: Medicare Other | Admitting: Endocrinology

## 2020-12-07 VITALS — BP 124/62 | HR 84 | Ht 66.0 in | Wt 142.0 lb

## 2020-12-07 DIAGNOSIS — E042 Nontoxic multinodular goiter: Secondary | ICD-10-CM | POA: Diagnosis not present

## 2020-12-07 DIAGNOSIS — E038 Other specified hypothyroidism: Secondary | ICD-10-CM

## 2020-12-07 NOTE — Progress Notes (Signed)
Patient ID: Tracie Blackwell, female   DOB: 18-Jul-1934, 87 y.o.   MRN: [id number]           Reason for Appointment:  follow-up of thyroid    History of Present Illness:   The patient's thyroid enlargement was first discovered in the 1960s  Apparently at that time because of relatively large size of nodules she had surgery done twice in [place] However subsequently she has had recurrence of swelling in her neck Apparently in 2016 her thyroid enlargement was progressively increasing especially on the left side  RECENT history:   On her initial visit she did not feel any symptoms of fatigue or cold intolerance However because of her free T4 being only 0.54 and normal TSH she was started on levothyroxine 50 g  She did not feel any different with starting the levothyroxine supplement However she felt her thyroid swelling was smaller after starting thyroid supplement Also she was able to lie flat in her bed instead of having to prop her head on pillows as before  She says she does not have any anterior neck discomfort or pressure in the neck and no difficulty swallowing Her left lobe previously had about a 6 cm nodule and right lobe was smaller  No complaints of unusual fatigue or cold intolerance  She has been continued on levothyroxine 62.5 mcg, last dosage change 07/2016 She has taken this regularly before breakfast  TSH is again quite normal along with free T4 which is improved  Wt Readings from Last 3 Encounters:  12/07/20 142 lb (64.4 kg)  12/05/19 143 lb 12.8 oz (65.2 kg)  12/05/18 145 lb 12.8 oz (66.1 kg)     Previous labs:   Lab Results  Component Value Date   FREET4 0.94 11/30/2020   FREET4 0.73 12/05/2019   FREET4 0.81 12/05/2018   TSH 0.53 11/30/2020   TSH 0.89 12/05/2019   TSH 0.86 12/05/2018    She has had an ultrasound exam in 06/2013 which showed the following: Multiple large nodules bilaterally. The dominant nodule in the right lobe is in the upper  pole measuring 3.4 cm. The dominant nodule in the left lobe is a complex nodule in the left upper pole measuring 6.1 cm.     Allergies as of 12/07/2020      Reactions   Penicillins    Other reaction(s): Angioedema      Medication List       Accurate as of Dec 07, 2020  9:36 AM. If you have any questions, ask your nurse or doctor.        levothyroxine 125 MCG tablet Commonly known as: SYNTHROID TAKE 1/2 TABLET BY MOUTH EVERY DAY       Allergies:  Allergies  Allergen Reactions  . Penicillins     Other reaction(s): Angioedema    No past medical history on file.  Past Surgical History:  Procedure Laterality Date  . BREAST SURGERY  1987  . MASTECTOMY Right 1985    Family History  Problem Relation Age of Onset  . Diabetes Mother   . Hypertension Mother   . Thyroid disease Sister   . Diabetes Sister   . Diabetes Brother   . Hypertension Father   . Breast cancer Neg Hx     Social History:  reports that she has never smoked. She has never used smokeless tobacco. No history on file for alcohol use and drug use.    Review of Systems   She is followed annually  for physical exam with PCP    Examination:   BP 124/62   Pulse 84   Ht 5\' 6"  (1.676 m)   Wt 142 lb (64.4 kg)   SpO2 98%   BMI 22.92 kg/m             Thyroid is visibly enlarged  Left lobe shows a nodule which is about 6.5 cm across and prominent, this is slightly firm Right lobe is about 3 times normal and slightly soft  Neck circumference is 38.5 cm over the thyroid Pemberton sign negative, no stridor  Biceps reflexes normal   Assessment/Plan:  Large bilateral multinodular goiter, diagnosed 50+ years ago Previously treated with surgery twice  She previously had reduction in the thyroid size using levothyroxine suppression which also relieved her local pressure symptoms  Since last exam her thyroid enlargement appears to be about the same  Left lobe is larger, possibly because it is  partially cystic She does not report any new neck pressure symptoms or difficulty swallowing   Secondary HYPOTHYROIDISM:  She was  asymptomatic with a low baseline free T4 level and did not feel different with supplementation  Has been on 62.5 mcg levothyroxine Thyroid levels are quite normal and she can continue the same dose   She will continue to follow-up annually unless she has any new symptoms   [name] 12/07/2020  Note: This office note was prepared with Dragon voice recognition system technology. Any transcriptional errors that result from this process are unintentional.

## 2021-03-08 IMAGING — MG DIGITAL SCREENING UNILAT LEFT W/ TOMO W/ CAD
4 series · 4 of 12 positions shown · non-contrast
Comparison: Previous exam(s).

CLINICAL DATA: Screening.

EXAM:
DIGITAL SCREENING UNILATERAL LEFT MAMMOGRAM WITH CAD AND TOMO

[L CC synth-2D]
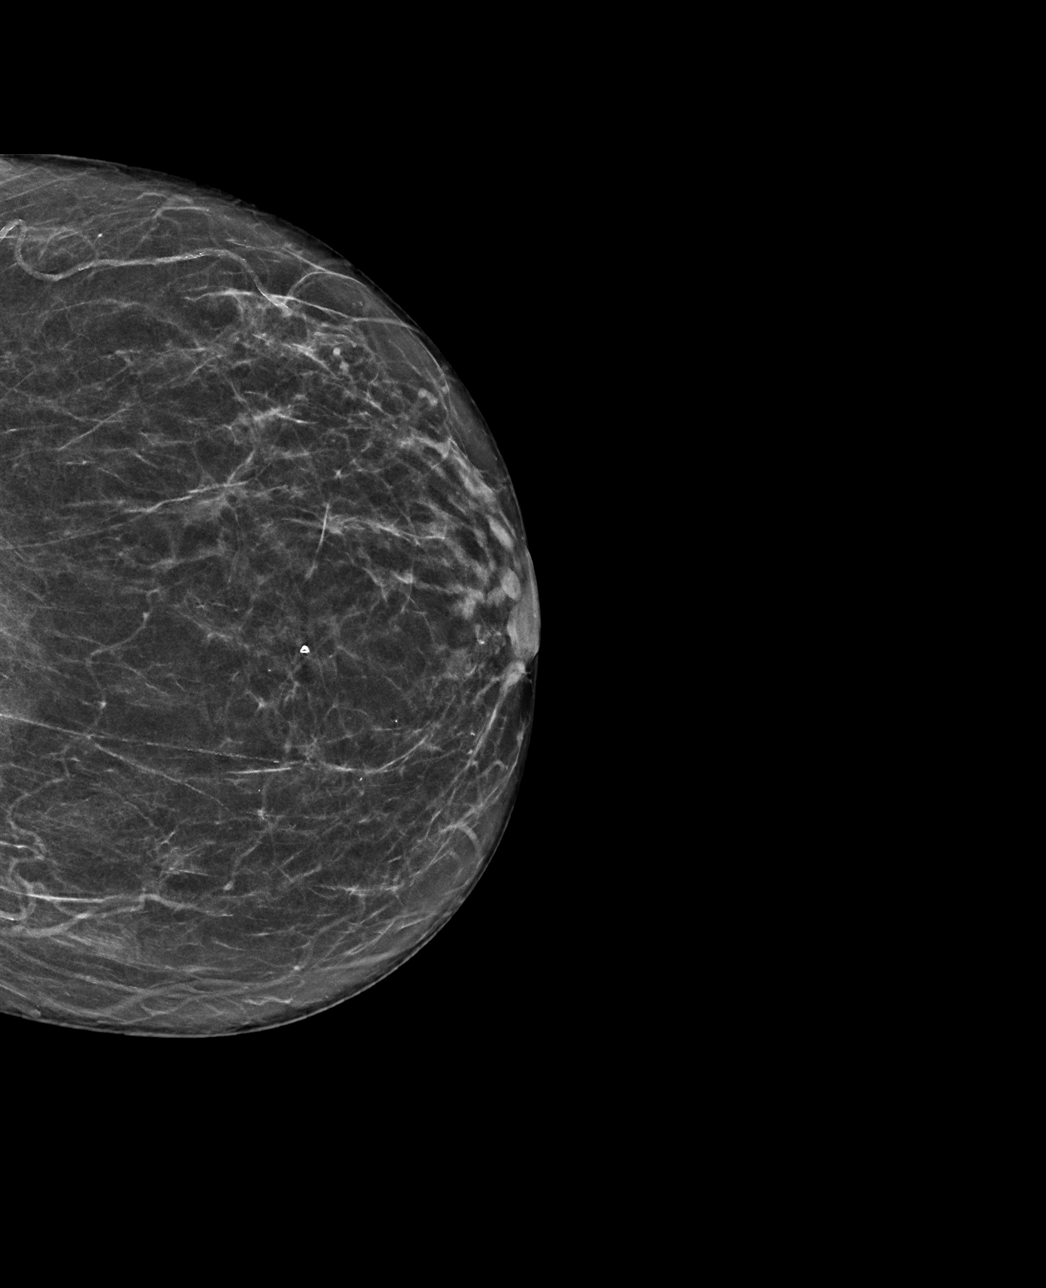

[L MLO synth-2D]
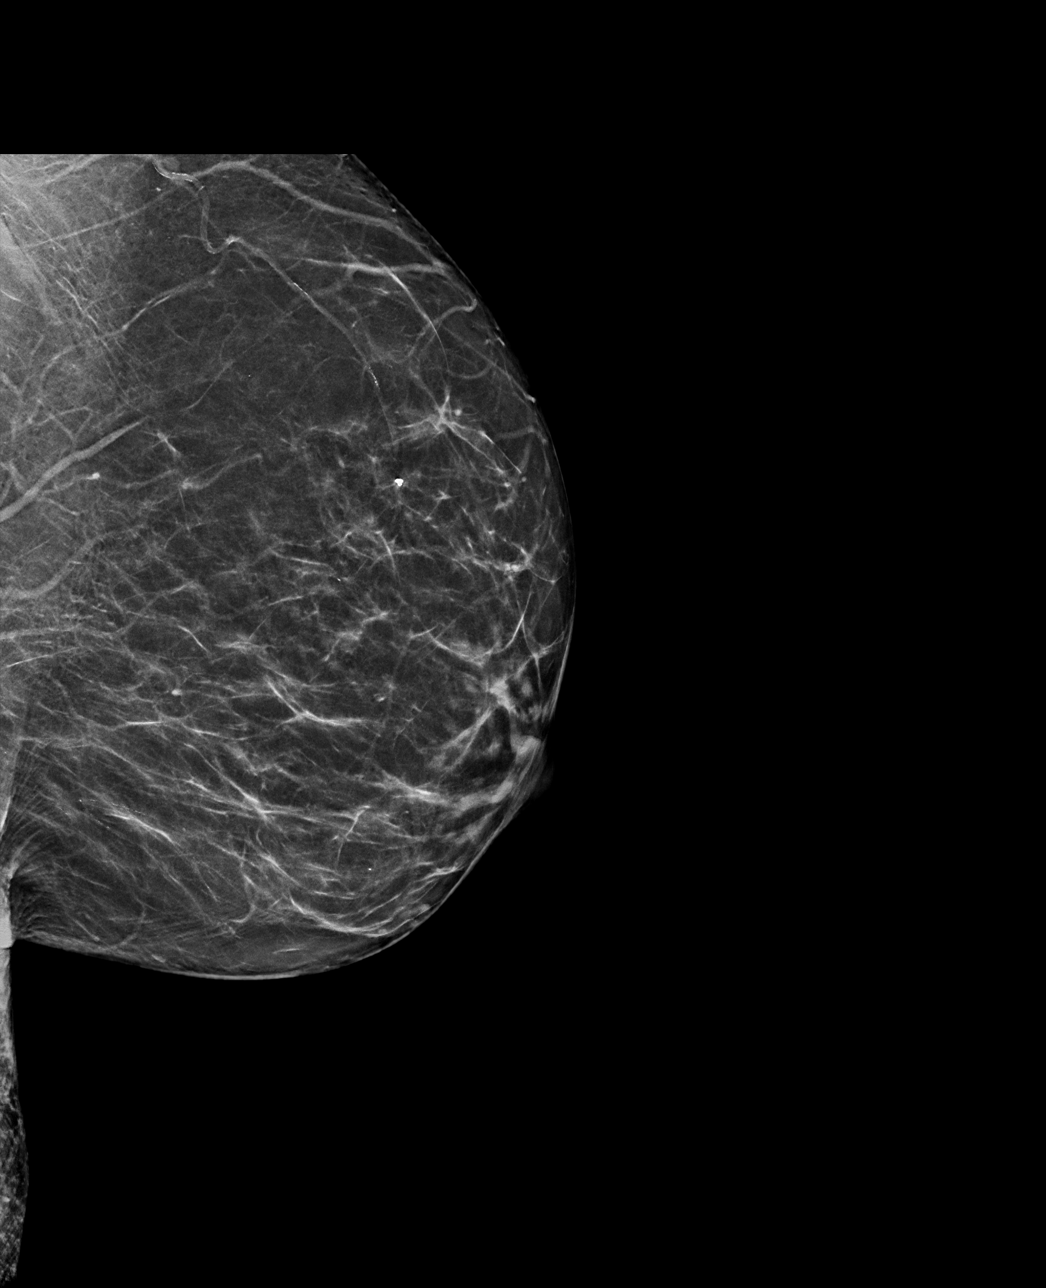

[L CC tomo · tomo slice 31/61.0]
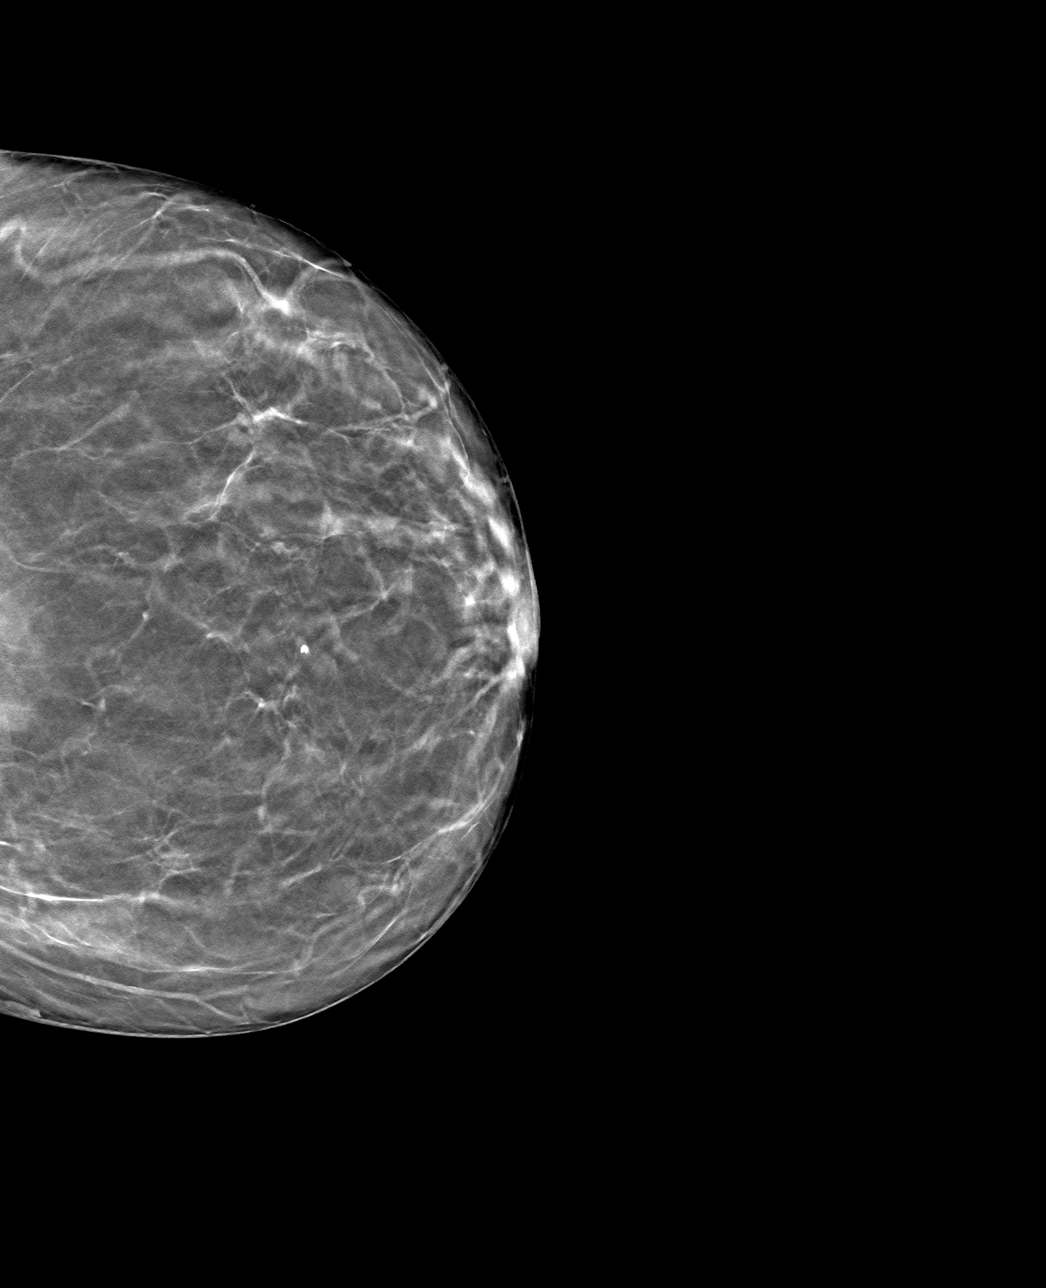

[L MLO tomo · tomo slice 32/63.0]
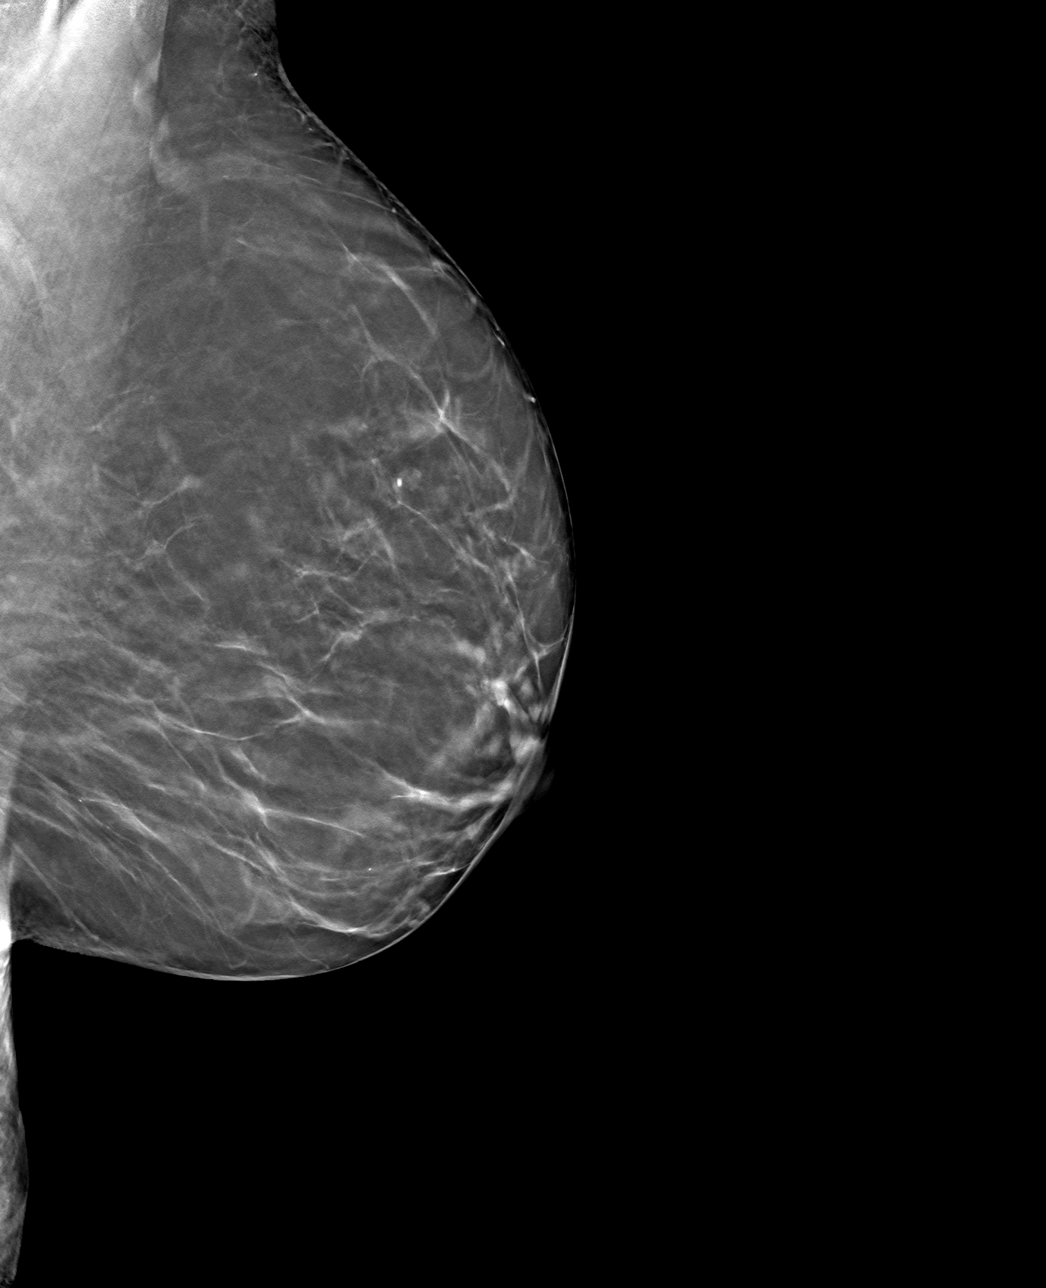

[4 of 12 positions shown; findings below may reference images not displayed]

ACR Breast Density Category b: There are scattered areas of
fibroglandular density.
FINDINGS: The patient has had a right mastectomy. There are no findings
suspicious for malignancy.

Images were processed with CAD.
IMPRESSION: No mammographic evidence of malignancy. A result letter of this
screening mammogram will be mailed directly to the patient.

RECOMMENDATION:
Screening mammogram in one year.  (Code:GY-Q-70Y)

BI-RADS CATEGORY  1: Negative.

## 2021-05-13 ENCOUNTER — Other Ambulatory Visit: Payer: Self-pay | Admitting: Infectious Diseases

## 2021-05-13 DIAGNOSIS — Z1231 Encounter for screening mammogram for malignant neoplasm of breast: Secondary | ICD-10-CM

## 2021-06-21 ENCOUNTER — Other Ambulatory Visit: Payer: Self-pay

## 2021-06-21 ENCOUNTER — Ambulatory Visit
Admission: RE | Admit: 2021-06-21 | Discharge: 2021-06-21 | Disposition: A | Discharge status: Home / Self Care | Payer: Medicare Other | Source: Ambulatory Visit | Attending: Infectious Diseases | Admitting: Infectious Diseases

## 2021-06-21 DIAGNOSIS — Z1231 Encounter for screening mammogram for malignant neoplasm of breast: Secondary | ICD-10-CM | POA: Diagnosis not present

## 2021-12-13 ENCOUNTER — Encounter: Payer: Self-pay | Admitting: Endocrinology

## 2021-12-13 ENCOUNTER — Ambulatory Visit (INDEPENDENT_AMBULATORY_CARE_PROVIDER_SITE_OTHER): Payer: Medicare Other | Admitting: Endocrinology

## 2021-12-13 VITALS — BP 138/60 | HR 77 | Ht 63.0 in | Wt 138.7 lb

## 2021-12-13 DIAGNOSIS — E038 Other specified hypothyroidism: Secondary | ICD-10-CM | POA: Diagnosis not present

## 2021-12-13 DIAGNOSIS — E042 Nontoxic multinodular goiter: Secondary | ICD-10-CM

## 2021-12-13 LAB — T4, FREE: Free T4: 0.87 ng/dL (ref 0.60–1.60)

## 2021-12-13 LAB — TSH: TSH: 0.73 u[IU]/mL (ref 0.35–5.50)

## 2021-12-13 NOTE — Progress Notes (Signed)
Patient ID: Tracie Blackwell, female   DOB: 17-Feb-1934, 88 y.o.   MRN: [id number] ? ?       ? ? ?Reason for Appointment:  follow-up of thyroid ? ? ? History of Present Illness:  ? ?The patient's thyroid enlargement was first discovered in the 1960s  ?Apparently at that time because of relatively large size of nodules she had surgery done twice in [place] ?However subsequently she has had recurrence of swelling in her neck ?Apparently in 2016 her thyroid enlargement was progressively increasing especially on the left side ? ?RECENT history:  ? ?On her initial visit she did not feel any symptoms of fatigue or cold intolerance ?However because of her free T4 being only 0.54 and normal TSH she was started on levothyroxine 50 ?g  ?She did not feel any physical change with starting the levothyroxine supplement ?However she felt her thyroid swelling was smaller after starting thyroid supplement ?Also she was able to lie flat in her bed instead of having to prop her head on pillows as before ? ?She again has felt well with no local pressure symptoms or difficulty swallowing ?Her left lobe previously had about a 6 cm nodule and right lobe was smaller ? ?No complaints of unusual fatigue or cold intolerance ? ?She has been continued on levothyroxine 62.5 mcg, last dosage change 07/2016 ?She has taken this daily before breakfast ? ? ?Wt Readings from Last 3 Encounters:  ?12/13/21 138 lb 11.2 oz (62.9 kg)  ?12/07/20 142 lb (64.4 kg)  ?12/05/19 143 lb 12.8 oz (65.2 kg)  ? ?Thyroid levels pending ? ?Previous labs: ? ? ?Lab Results  ?Component Value Date  ? FREET4 0.94 11/30/2020  ? FREET4 0.73 12/05/2019  ? FREET4 0.81 12/05/2018  ? TSH 0.53 11/30/2020  ? TSH 0.89 12/05/2019  ? TSH 0.86 12/05/2018  ? ? ?She has had an ultrasound exam in 06/2013 which showed the following: ?Multiple large nodules bilaterally.  The dominant nodule in the right lobe is in the upper pole measuring 3.4 cm.  The dominant ?nodule in the left lobe is a  complex nodule in the left upper pole measuring 6.1 cm. ? ?  ? ?Allergies as of 12/13/2021   ? ?   Reactions  ? Penicillins   ? Other reaction(s): Angioedema  ? ?  ? ?  ?Medication List  ?  ? ?  ? Accurate as of Dec 13, 2021  8:41 AM. If you have any questions, ask your nurse or doctor.  ?  ?  ? ?  ? ?levothyroxine 125 MCG tablet ?Commonly known as: SYNTHROID ?TAKE 1/2 TABLET BY MOUTH EVERY DAY ?  ? ?  ? ? ?Allergies:  ?Allergies  ?Allergen Reactions  ? Penicillins   ?  Other reaction(s): Angioedema  ? ? ?No past medical history on file. ? ?Past Surgical History:  ?Procedure Laterality Date  ? BREAST SURGERY  1987  ? MASTECTOMY Right 1985  ? ? ?Family History  ?Problem Relation Age of Onset  ? Diabetes Mother   ? Hypertension Mother   ? Thyroid disease Sister   ? Diabetes Sister   ? Diabetes Brother   ? Hypertension Father   ? Breast cancer Neg Hx   ? ? ?Social History:  reports that she has never smoked. She has never used smokeless tobacco. No history on file for alcohol use and drug use. ? ? ? ?Review of Systems ? ? ?She is followed annually for physical exam with PCP ? ? ?  Examination: ?  ?BP 138/60   Pulse 77   Ht '5\' 3"'$  (1.6 m)   Wt 138 lb 11.2 oz (62.9 kg)   SpO2 98%   BMI 24.57 kg/m?  ? ?She looks well ? ?         Thyroid exam to the following ? ?Left lobe again has a nodule which is about 6 cm across and prominent, this is smooth and firm ?Right lobe is about 3 times normal and relatively soft ?Not as prominent ? ?Neck circumference is 38.5 cm over the thyroid ?Pemberton sign negative, no stridor ? ?Biceps reflexes show normal relaxation ? ? ?Assessment/Plan: ? ?Large bilateral multinodular goiter, diagnosed 50+ years ago ?Previously treated with surgery twice ? ?She previously had reduction in the thyroid size using levothyroxine suppression which also relieved her local pressure symptoms ?Subsequently dose thyroid size has been unchanged for some time ?No change again today ?And no local pressure  symptoms ? ?Secondary HYPOTHYROIDISM:  ?She was  asymptomatic with a low baseline free T4 level and did not feel different with supplementation ? ?Has been on 62.5 mcg levothyroxine which she takes regularly ?Thyroid levels to be checked again today ? ?She will continue to follow-up annually  ? ? ?[name] ?12/13/2021 ? ?Note: This office note was prepared with Estate agent. Any transcriptional errors that result from this process are unintentional. ? ? ? ? ? ?

## 2021-12-15 ENCOUNTER — Other Ambulatory Visit: Payer: Self-pay | Admitting: Endocrinology

## 2021-12-15 NOTE — Progress Notes (Signed)
Please call to let patient know that the lab results are normal, continue same medication for 1 year

## 2022-05-15 ENCOUNTER — Other Ambulatory Visit: Payer: Self-pay | Admitting: Infectious Diseases

## 2022-05-15 DIAGNOSIS — Z1231 Encounter for screening mammogram for malignant neoplasm of breast: Secondary | ICD-10-CM

## 2022-06-26 ENCOUNTER — Ambulatory Visit
Admission: RE | Admit: 2022-06-26 | Discharge: 2022-06-26 | Disposition: A | Discharge status: Home / Self Care | Payer: Medicare Other | Source: Ambulatory Visit | Attending: Infectious Diseases | Admitting: Infectious Diseases

## 2022-06-26 DIAGNOSIS — Z1231 Encounter for screening mammogram for malignant neoplasm of breast: Secondary | ICD-10-CM | POA: Diagnosis present

## 2022-12-09 ENCOUNTER — Other Ambulatory Visit: Payer: Self-pay | Admitting: Endocrinology

## 2022-12-12 ENCOUNTER — Encounter: Payer: Self-pay | Admitting: Endocrinology

## 2022-12-12 ENCOUNTER — Ambulatory Visit (INDEPENDENT_AMBULATORY_CARE_PROVIDER_SITE_OTHER): Payer: Medicare Other | Admitting: Endocrinology

## 2022-12-12 VITALS — BP 130/70 | HR 77 | Ht 63.0 in | Wt 135.6 lb

## 2022-12-12 DIAGNOSIS — E038 Other specified hypothyroidism: Secondary | ICD-10-CM

## 2022-12-12 DIAGNOSIS — E042 Nontoxic multinodular goiter: Secondary | ICD-10-CM | POA: Diagnosis not present

## 2022-12-12 LAB — TSH: TSH: 0.56 u[IU]/mL (ref 0.35–5.50)

## 2022-12-12 LAB — T4, FREE: Free T4: 0.97 ng/dL (ref 0.60–1.60)

## 2022-12-12 NOTE — Progress Notes (Signed)
Patient ID: Tracie Blackwell, female   DOB: 05-14-1934, 89 y.o.   MRN: [id number]           Reason for Appointment:  follow-up of thyroid    History of Present Illness:   The patient's thyroid enlargement was first discovered in the 1960s  Apparently at that time because of relatively large size of nodules she had surgery done twice in [place] However subsequently she has had recurrence of swelling in her neck Apparently in 2016 her thyroid enlargement was progressively increasing especially on the left side  RECENT history:   On her initial visit she did not feel any symptoms of fatigue or cold intolerance However because of her free T4 being only 0.54 and normal TSH she was started on levothyroxine 50 g  She did not feel any different physically with starting the levothyroxine supplement However she felt that her thyroid swelling was smaller after starting thyroid supplement Also she was able to lie flat in her bed instead of having to prop her head on pillows as before  Does not complain of any local pressure symptoms, difficulty lying down flat and no difficulty swallowing  Thyroid ultrasound findings previously are as below  She feels fairly good with her energy level She has lost some weight without change in appetite  She has been continued on levothyroxine 62.5 mcg, last dosage change 07/2016 She has taken this consistently before breakfast   Wt Readings from Last 3 Encounters:  12/12/22 135 lb 9.6 oz (61.5 kg)  12/13/21 138 lb 11.2 oz (62.9 kg)  12/07/20 142 lb (64.4 kg)   Thyroid levels pending  Previous labs:   Lab Results  Component Value Date   FREET4 0.87 12/13/2021   FREET4 0.94 11/30/2020   FREET4 0.73 12/05/2019   TSH 0.73 12/13/2021   TSH 0.53 11/30/2020   TSH 0.89 12/05/2019    She has had an ultrasound exam in 06/2013 which showed the following: Multiple large nodules bilaterally.  The dominant nodule in the right lobe is in the upper pole  measuring 3.4 cm.  The dominant nodule in the left lobe is a complex nodule in the left upper pole measuring 6.1 cm.     Allergies as of 12/12/2022       Reactions   Penicillins    Other reaction(s): Angioedema        Medication List        Accurate as of Dec 12, 2022  9:05 AM. If you have any questions, ask your nurse or doctor.          levothyroxine 125 MCG tablet Commonly known as: SYNTHROID TAKE 1/2 TABLET BY MOUTH EVERY DAY        Allergies:  Allergies  Allergen Reactions   Penicillins     Other reaction(s): Angioedema    No past medical history on file.  Past Surgical History:  Procedure Laterality Date   BREAST SURGERY  1987   MASTECTOMY Right 1985    Family History  Problem Relation Age of Onset   Diabetes Mother    Hypertension Mother    Thyroid disease Sister    Diabetes Sister    Diabetes Brother    Hypertension Father    Breast cancer Neg Hx     Social History:  reports that she has never smoked. She has never used smokeless tobacco. No history on file for alcohol use and drug use.    Review of Systems   She is followed annually  for physical exam with PCP    Examination:   BP 130/70 (BP Location: Left Arm, Patient Position: Sitting, Cuff Size: Normal)   Pulse 77   Ht 5\' 3"  (1.6 m)   Wt 135 lb 9.6 oz (61.5 kg)   SpO2 98%   BMI 24.02 kg/m   She looks well  Thyroid:  Left lobe of the thyroid shows a prominent nodule which is about 6 cm across, this feels firm and smooth Right lobe is about 3 times normal and relatively softer and flatter  Neck circumference is 38 cm over the thyroid Pemberton sign negative, no stridor on auscultation  Biceps reflexes show normal relaxation   Assessment/Plan:  Large bilateral multinodular goiter, diagnosed 50+ years ago Previously treated with surgery twice  She previously had reduction in the thyroid size using levothyroxine suppression which also relieved her local pressure  symptoms Subsequently dose thyroid size has been unchanged, also stable on exam today compared to last year  Secondary HYPOTHYROIDISM:  She was  asymptomatic with a low baseline free T4 level and did not feel different with starting supplementation  Has been on 62.5 mcg levothyroxine every morning Thyroid levels to be checked again today  She will continue to follow-up annually unless she has any new symptoms   Reather Littler 12/12/2022  Note: This office note was prepared with Dragon voice recognition system technology. Any transcriptional errors that result from this process are unintentional.  Addendum: Thyroid levels normal

## 2023-01-07 ENCOUNTER — Other Ambulatory Visit: Payer: Self-pay | Admitting: Endocrinology

## 2023-04-10 ENCOUNTER — Telehealth: Payer: Self-pay | Admitting: Endocrinology

## 2023-04-10 MED ORDER — LEVOTHYROXINE SODIUM 125 MCG PO TABS: 62.5000 ug | ORAL_TABLET | Freq: Every day | ORAL | 3 refills | Status: DC

## 2023-04-10 NOTE — Telephone Encounter (Signed)
Done

## 2023-04-10 NOTE — Telephone Encounter (Signed)
MEDICATION:  levothyroxine levothyroxine (SYNTHROID) 125 MCG tablet  PHARMACY:  [id number] [place], [place] - [address] (Ph: [phone])   HAS THE PATIENT CONTACTED THEIR PHARMACY?  Yes  IS THIS A 90 DAY SUPPLY : Yes  IS PATIENT OUT OF MEDICATION: No  IF NOT; HOW MUCH IS LEFT: 5Days  LAST APPOINTMENT DATE: @5 /14/2024  NEXT APPOINTMENT DATE:@5 /15/2025  DO WE HAVE YOUR PERMISSION TO LEAVE A DETAILED MESSAGE?: Yes  OTHER COMMENTS:    **Let patient know to contact pharmacy at the end of the day to make sure medication is ready. **  ** Please notify patient to allow 48-72 hours to process**  **Encourage patient to contact the pharmacy for refills or they can request refills through [facility]**

## 2023-05-22 ENCOUNTER — Other Ambulatory Visit: Payer: Self-pay | Admitting: Infectious Diseases

## 2023-05-22 DIAGNOSIS — Z1231 Encounter for screening mammogram for malignant neoplasm of breast: Secondary | ICD-10-CM

## 2023-07-02 ENCOUNTER — Other Ambulatory Visit: Payer: Self-pay | Admitting: Infectious Diseases

## 2023-07-02 ENCOUNTER — Ambulatory Visit
Admission: RE | Admit: 2023-07-02 | Discharge: 2023-07-02 | Disposition: A | Discharge status: Home / Self Care | Payer: Medicare Other | Source: Ambulatory Visit | Attending: Infectious Diseases | Admitting: Infectious Diseases

## 2023-07-02 DIAGNOSIS — Z1231 Encounter for screening mammogram for malignant neoplasm of breast: Secondary | ICD-10-CM | POA: Diagnosis present

## 2023-07-04 ENCOUNTER — Encounter: Payer: Self-pay | Admitting: Infectious Diseases

## 2023-07-05 ENCOUNTER — Other Ambulatory Visit: Payer: Self-pay | Admitting: Infectious Diseases

## 2023-07-05 DIAGNOSIS — R928 Other abnormal and inconclusive findings on diagnostic imaging of breast: Secondary | ICD-10-CM

## 2023-07-09 ENCOUNTER — Inpatient Hospital Stay
Admission: RE | Admit: 2023-07-09 | Discharge: 2023-07-09 | Disposition: A | Discharge status: Home / Self Care | Payer: Medicare Other | Source: Ambulatory Visit | Attending: Infectious Diseases | Admitting: Infectious Diseases

## 2023-07-09 ENCOUNTER — Ambulatory Visit
Admission: RE | Admit: 2023-07-09 | Discharge: 2023-07-09 | Disposition: A | Discharge status: Home / Self Care | Payer: Medicare Other | Source: Ambulatory Visit | Attending: Infectious Diseases | Admitting: Infectious Diseases

## 2023-07-09 DIAGNOSIS — R928 Other abnormal and inconclusive findings on diagnostic imaging of breast: Secondary | ICD-10-CM | POA: Diagnosis present

## 2023-07-11 ENCOUNTER — Other Ambulatory Visit: Payer: Self-pay | Admitting: Infectious Diseases

## 2023-07-11 DIAGNOSIS — R928 Other abnormal and inconclusive findings on diagnostic imaging of breast: Secondary | ICD-10-CM

## 2023-07-11 DIAGNOSIS — N6489 Other specified disorders of breast: Secondary | ICD-10-CM

## 2023-08-02 ENCOUNTER — Ambulatory Visit
Admission: RE | Admit: 2023-08-02 | Discharge: 2023-08-02 | Disposition: A | Discharge status: Home / Self Care | Payer: Medicare Other | Source: Ambulatory Visit | Attending: Infectious Diseases | Admitting: Infectious Diseases

## 2023-08-02 DIAGNOSIS — R928 Other abnormal and inconclusive findings on diagnostic imaging of breast: Secondary | ICD-10-CM | POA: Insufficient documentation

## 2023-08-02 DIAGNOSIS — N6489 Other specified disorders of breast: Secondary | ICD-10-CM | POA: Insufficient documentation

## 2023-08-02 HISTORY — PX: BREAST BIOPSY: SHX20

## 2023-08-02 MED ORDER — LIDOCAINE 1 % OPTIME INJ - NO CHARGE
5.0000 mL | Freq: Once | INTRAMUSCULAR | Status: AC
  Administered 2023-08-02: 5 mL
  Filled 2023-08-02: qty 6

## 2023-08-02 MED ORDER — LIDOCAINE HCL 1 % IJ SOLN
10.0000 mL | Freq: Once | INTRAMUSCULAR | Status: AC
  Administered 2023-08-02: 10 mL
  Filled 2023-08-02: qty 10

## 2023-08-02 MED ORDER — LIDOCAINE-EPINEPHRINE 2 %-1:100000 IJ SOLN
10.0000 mL | Freq: Once | INTRAMUSCULAR | Status: DC
  Filled 2023-08-02: qty 10.2

## 2023-08-06 LAB — SURGICAL PATHOLOGY

## 2023-12-11 ENCOUNTER — Encounter: Payer: Self-pay | Admitting: Endocrinology

## 2023-12-11 ENCOUNTER — Ambulatory Visit (INDEPENDENT_AMBULATORY_CARE_PROVIDER_SITE_OTHER): Payer: Medicare Other | Admitting: Endocrinology

## 2023-12-11 VITALS — BP 126/80 | HR 78 | Resp 20 | Ht 63.0 in | Wt 139.8 lb

## 2023-12-11 DIAGNOSIS — E042 Nontoxic multinodular goiter: Secondary | ICD-10-CM

## 2023-12-11 DIAGNOSIS — E039 Hypothyroidism, unspecified: Secondary | ICD-10-CM

## 2023-12-11 LAB — T4, FREE: Free T4: 1.1 ng/dL (ref 0.8–1.8)

## 2023-12-11 LAB — TSH: TSH: 0.94 m[IU]/L (ref 0.40–4.50)

## 2023-12-11 NOTE — Progress Notes (Signed)
 Outpatient Endocrinology Note [place] Jerran Tappan, MD   Patient's Name: Tracie Blackwell    DOB: 10-23-1933    MRN: [id number]  REASON OF VISIT: Follow-up for thyroid  nodules /multinodular goiter / hypothyroidism  PCP: Eartha Gold, MD  HISTORY OF PRESENT ILLNESS:   Tracie Blackwell is a 89 y.o. old female with past medical history as listed below is presented for a follow up  of thyroid  nodules /multinodular goiter / hypothyroidism  Pertinent Thyroid  History:  Patient was previously seen by Dr. Hubert Madden and was last time seen in May 2024.  Patient is known to have enlargement of thyroid  in 1960s.  She had thyroid  surgery twice in the remote past in [place]. Patient reports pathology and cytology reports in the past were benign.  No records available to review. Apparently in 2016 thyroid  enlargement was progressively increasing especially on the left side.  Based on prior endocrinology note, on the initial visit patient did not have symptoms of including fatigue or cold intolerance, however because of the free T4 being only 0.54 and normal TSH in 2017, she was started on levothyroxine  50 mcg daily.  She did not feel any difference on the symptom however she felt thyroid  swelling was smaller after being on thyroid  hormone replacement, also she was able to lie flat in her bed instead of having to prop her head on pillows as before.  She was previously managed as secondary hypothyroidism due to low free T4 with normal TSH at the time of starting levothyroxine  in 2017.  She has been on a stable dose of levothyroxine  62.5 mcg daily since January 2018.  She has had an ultrasound exam in 06/2013 which showed the following: Multiple large nodules bilaterally.  The dominant nodule in the right lobe is in the upper pole measuring 3.4 cm.  The dominant nodule in the left lobe is a complex nodule in the left upper pole measuring 6.1 cm.  Thyroid  nodules are being monitored clinically over time.     Interval history  Patient has been taking levothyroxine  125 mcg half tablet daily.  She denies palpitation or heat intolerance.  Overall feeling good.  She felt that bilateral thyroid  nodules have remained stable, no neck discomfort, compressive symptoms, change in voice or difficulty breathing in any position.  She has been taking levothyroxine  in the morning before breakfast.  No other complaints today.  REVIEW OF SYSTEMS:  As per history of present illness.   PAST MEDICAL HISTORY: History reviewed. No pertinent past medical history.  PAST SURGICAL HISTORY: Past Surgical History:  Procedure Laterality Date   BREAST BIOPSY Left 08/02/2023   stereo biopsy/ ribbon clip/ path pending   BREAST BIOPSY Left 08/02/2023   MM LT BREAST BX W LOC DEV 1ST LESION IMAGE BX SPEC STEREO GUIDE 08/02/2023 [facility]   BREAST SURGERY  1987   MASTECTOMY Right 1985    ALLERGIES: Allergies  Allergen Reactions   Penicillins     Other reaction(s): Angioedema    FAMILY HISTORY:  Family History  Problem Relation Age of Onset   Diabetes Mother    Hypertension Mother    Thyroid  disease Sister    Diabetes Sister    Diabetes Brother    Hypertension Father    Breast cancer Neg Hx     SOCIAL HISTORY: Social History   Socioeconomic History   Marital status: Married    Spouse name: Not on file   Number of children: Not on file   Years of education: Not  on file   Highest education level: Not on file  Occupational History   Not on file  Tobacco Use   Smoking status: Never   Smokeless tobacco: Never  Substance and Sexual Activity   Alcohol use: Not on file   Drug use: Not on file   Sexual activity: Not on file  Other Topics Concern   Not on file  Social History Narrative   Not on file   Social Drivers of Health   Financial Resource Strain: Low Risk  (05/21/2023)   Received from [facility]   Overall Financial Resource Strain (CARDIA)    Difficulty of Paying  Living Expenses: Not hard at all  Food Insecurity: No Food Insecurity (05/21/2023)   Received from [facility]   Hunger Vital Sign    Worried About Running Out of Food in the Last Year: Never true    Ran Out of Food in the Last Year: Never true  Transportation Needs: No Transportation Needs (05/21/2023)   Received from [facility]    In the past 12 months, has lack of transportation kept you from medical appointments or from getting medications?: No    Lack of Transportation (Non-Medical): No  Physical Activity: Not on file  Stress: Not on file  Social Connections: Not on file    MEDICATIONS:  Current Outpatient Medications  Medication Sig Dispense Refill   levothyroxine  (SYNTHROID ) 125 MCG tablet Take 0.5 tablets (62.5 mcg total) by mouth daily. 45 tablet 3   No current facility-administered medications for this visit.    PHYSICAL EXAM: Vitals:   12/11/23 0805  BP: 126/80  Pulse: 78  Resp: 20  SpO2: 96%  Weight: 139 lb 12.8 oz (63.4 kg)  Height: 5\' 3"  (1.6 m)   Body mass index is 24.76 kg/m.  Wt Readings from Last 3 Encounters:  12/11/23 139 lb 12.8 oz (63.4 kg)  12/12/22 135 lb 9.6 oz (61.5 kg)  12/13/21 138 lb 11.2 oz (62.9 kg)     General: Well developed, well nourished female in no apparent distress.  HEENT: AT/[place], no external lesions. Hearing intact to the spoken word Eyes: EOMI. No stare, proptosis. Conjunctiva clear and no icterus. Neck: Trachea midline, neck supple with appreciable thyromegaly bilaterally or no lymphadenopathy and bilateral palpable thyroid  nodule, right thyroid  nodule about 3 cm and left thyroid  nodule about 5 cm nontender, firm and mobile. Lungs: Clear to auscultation, no wheeze. Respirations not labored Heart: S1S2, Regular in rate and rhythm.  Abdomen: Soft, non tender, non distended Neurologic: Alert, oriented, normal speech, deep tendon biceps reflexes normal,  no gross focal  neurological deficit Extremities: No pedal pitting edema, no tremors of outstretched hands Skin: Warm, color good.  Psychiatric: Does not appear depressed or anxious  PERTINENT HISTORIC LABORATORY AND IMAGING STUDIES:  All pertinent laboratory results were reviewed. Please see HPI also for further details.   TSH  Date Value Ref Range Status  12/12/2022 0.56 0.35 - 5.50 uIU/mL Final  12/13/2021 0.73 0.35 - 5.50 uIU/mL Final  11/30/2020 0.53 0.35 - 4.50 uIU/mL Final     ASSESSMENT / PLAN  1. Multinodular goiter   2. Hypothyroidism, unspecified type    Large bilateral multinodular goiter diagnosed 50+ years ago previously treated with surgery twice.  Patient previously failed reduction in thyroid  size using levothyroxine  suppression which also relieved her local pressure symptoms in the neck.  Patient reports pathology and cytology reports in the past were  benign.  No records available to review.  She has been taking levothyroxine  62.5 mcg daily.  She was previously was managed as secondary hypothyroidism as she had low free T4 and normal TSH at the time of restarting levothyroxine  in 2017.  Plan: - Will continue to monitor bilateral thyroid  nodules/goiter clinically.  Will plan for ultrasound neck if clinically indicated.  Patient is asked to call our clinic if she develops any new neck related symptoms. -Will check TSH, free T4. -She is currently taking levothyroxine  62.5 mcg daily.  Half tablet of 125 mcg. - Annual endocrinology follow-up.  Diagnoses and all orders for this visit:  Multinodular goiter  Hypothyroidism, unspecified type -     T4, free -     TSH    DISPOSITION Follow up in clinic in 12 months suggested.  Labs on the same day of visit.  All questions answered and patient verbalized understanding of the plan.  [place] Micala Saltsman, MD [facility] End[facility] [address] [place], [address] [place], [place] [address] Phone # [phone]  At  least part of this note was generated using voice recognition software. Inadvertent word errors may have occurred, which were not recognized during the proofreading process.

## 2023-12-12 ENCOUNTER — Other Ambulatory Visit: Payer: Self-pay | Admitting: Endocrinology

## 2023-12-12 ENCOUNTER — Ambulatory Visit: Payer: Self-pay | Admitting: Endocrinology

## 2023-12-12 DIAGNOSIS — E039 Hypothyroidism, unspecified: Secondary | ICD-10-CM

## 2023-12-12 MED ORDER — LEVOTHYROXINE SODIUM 125 MCG PO TABS: 62.5000 ug | ORAL_TABLET | Freq: Every day | ORAL | 3 refills | Status: AC

## 2023-12-12 NOTE — Telephone Encounter (Signed)
-----   Message from [place] Thapa sent at 12/12/2023  8:12 AM EDT ----- Please notify patient of labs reviewed normal thyroid  function test, continue current dose of levothyroxine .  No change.

## 2023-12-12 NOTE — Telephone Encounter (Signed)
 Patient given results and medication changes as directed by MD.

## 2023-12-13 ENCOUNTER — Ambulatory Visit: Payer: Medicare Other | Admitting: Endocrinology

## 2024-05-26 ENCOUNTER — Other Ambulatory Visit: Payer: Self-pay | Admitting: Infectious Diseases

## 2024-05-26 DIAGNOSIS — Z1231 Encounter for screening mammogram for malignant neoplasm of breast: Secondary | ICD-10-CM

## 2024-07-07 ENCOUNTER — Encounter

## 2024-07-10 ENCOUNTER — Inpatient Hospital Stay
Admission: RE | Admit: 2024-07-10 | Discharge: 2024-07-10 | Attending: Infectious Diseases | Admitting: Infectious Diseases

## 2024-07-10 DIAGNOSIS — Z1231 Encounter for screening mammogram for malignant neoplasm of breast: Secondary | ICD-10-CM | POA: Diagnosis present

## 2024-12-15 ENCOUNTER — Ambulatory Visit: Admitting: Endocrinology
